# Patient Record
Sex: Male | Born: 1955 | Race: Black or African American | Hispanic: No | Marital: Married | Smoking: Never smoker
Health system: Southern US, Community
[De-identification: ages and names within clinical notes are randomized; demographics above are authoritative.]

## PROBLEM LIST (undated history)

## (undated) DIAGNOSIS — G9332 Myalgic encephalomyelitis/chronic fatigue syndrome: Secondary | ICD-10-CM

## (undated) DIAGNOSIS — M1990 Unspecified osteoarthritis, unspecified site: Secondary | ICD-10-CM

## (undated) DIAGNOSIS — N529 Male erectile dysfunction, unspecified: Secondary | ICD-10-CM

## (undated) DIAGNOSIS — I10 Essential (primary) hypertension: Secondary | ICD-10-CM

## (undated) DIAGNOSIS — M352 Behcet's disease: Secondary | ICD-10-CM

## (undated) DIAGNOSIS — R5382 Chronic fatigue, unspecified: Secondary | ICD-10-CM

## (undated) HISTORY — PX: BACK SURGERY: SHX140

## (undated) HISTORY — DX: Chronic fatigue, unspecified: R53.82

## (undated) HISTORY — DX: Essential (primary) hypertension: I10

## (undated) HISTORY — DX: Unspecified osteoarthritis, unspecified site: M19.90

## (undated) HISTORY — PX: COLONOSCOPY: SHX174

## (undated) HISTORY — DX: Behcet's disease: M35.2

## (undated) HISTORY — PX: HEMORROIDECTOMY: SUR656

## (undated) HISTORY — PX: MENISCUS REPAIR: SHX5179

## (undated) HISTORY — DX: Myalgic encephalomyelitis/chronic fatigue syndrome: G93.32

## (undated) HISTORY — DX: Male erectile dysfunction, unspecified: N52.9

---

## 2007-04-02 ENCOUNTER — Emergency Department (HOSPITAL_COMMUNITY): Admission: EM | Admit: 2007-04-02 | Discharge: 2007-04-02 | Payer: Self-pay | Admitting: Emergency Medicine

## 2008-09-15 ENCOUNTER — Ambulatory Visit (HOSPITAL_COMMUNITY): Admission: RE | Admit: 2008-09-15 | Discharge: 2008-09-15 | Payer: Self-pay | Admitting: *Deleted

## 2008-09-15 ENCOUNTER — Emergency Department (HOSPITAL_COMMUNITY): Admission: EM | Admit: 2008-09-15 | Discharge: 2008-09-15 | Payer: Self-pay | Admitting: Emergency Medicine

## 2008-09-15 ENCOUNTER — Encounter (INDEPENDENT_AMBULATORY_CARE_PROVIDER_SITE_OTHER): Payer: Self-pay | Admitting: *Deleted

## 2009-12-23 ENCOUNTER — Ambulatory Visit: Payer: Self-pay | Admitting: Family Medicine

## 2010-02-02 ENCOUNTER — Ambulatory Visit: Payer: Self-pay | Admitting: Family Medicine

## 2010-02-13 ENCOUNTER — Encounter: Admission: RE | Admit: 2010-02-13 | Discharge: 2010-02-13 | Payer: Self-pay | Admitting: Rheumatology

## 2010-06-03 ENCOUNTER — Ambulatory Visit: Payer: Self-pay | Admitting: Family Medicine

## 2010-11-23 LAB — URINALYSIS, ROUTINE W REFLEX MICROSCOPIC
Glucose, UA: NEGATIVE mg/dL
Ketones, ur: NEGATIVE mg/dL
Nitrite: NEGATIVE

## 2010-11-23 LAB — BASIC METABOLIC PANEL
BUN: 6 mg/dL (ref 6–23)
Calcium: 8.8 mg/dL (ref 8.4–10.5)
Chloride: 105 mEq/L (ref 96–112)
Creatinine, Ser: 1.14 mg/dL (ref 0.4–1.5)
GFR calc Af Amer: >60 mL/min (ref >60)
Glucose, Bld: 115 mg/dL — ABNORMAL HIGH (ref 70–99)
Potassium: 3 mEq/L — ABNORMAL LOW (ref 3.5–5.1)

## 2010-11-23 LAB — DIFFERENTIAL
Monocytes Absolute: 0.5 10*3/uL (ref 0.1–1.0)
Neutro Abs: 4.5 10*3/uL (ref 1.7–7.7)
Neutrophils Relative %: 75 % (ref 43–77)

## 2010-11-23 LAB — CBC
HCT: 40.2 % (ref 39.0–52.0)
Hemoglobin: 13.7 g/dL (ref 13.0–17.0)
MCV: 88.5 fL (ref 78.0–100.0)
Platelets: 244 10*3/uL (ref 150–400)
WBC: 6.1 10*3/uL (ref 4.0–10.5)

## 2010-12-21 NOTE — Op Note (Signed)
[name], Dylan Callahan               ACCOUNT NO.:  [id number]   MEDICAL RECORD NO.:  [id number]          PATIENT TYPE:  AMB   LOCATION:  DAY                          FACILITY:  [facility]   PHYSICIAN:  Alfonse Ras, MD   DATE OF BIRTH:  [birth year]   DATE OF PROCEDURE:  DATE OF DISCHARGE:                               OPERATIVE REPORT   PREOPERATIVE DIAGNOSIS:  Grade III internal hemorrhoids with prolapse.   POSTOPERATIVE DIAGNOSIS:  Grade III internal hemorrhoids with prolapse.   PROCEDURE:  PPH rectopexy.   SURGEON:  Alfonse Ras, MD   ANESTHESIA:  General.   INDICATIONS:  The patient is a very pleasant 55 year old white male with  significant prolapse of internal hemorrhoids and bleeding who was  evaluated in the office and was counseled.  The patient has opted for  [facility] hemorrhoidectomy.  Literature was given to him and extensive  informed consent was granted.  He was then taken to the operating room  at [facility], given general anesthesia by endotracheal tube,  and placed in a prone jack-knife position.  Perianal and rectal prep  were undertaken with Betadine.  The anal dilatation was accomplished to  3 fingerbreadths.  The internal hemorrhoidal bundles were injected with  0.5 Marcaine with Wydase  x3, and then the internal and external  sphincter muscles were injected with additional 0.5 Marcaine.  I then  placed 2-0 pursestring suture 5 cm proximal to the dentate line in the  mucosa and submucosa circumferentially.  After this was accomplished,  stapler was introduced and the pursestring suture was tied down around  the anvil.  It was closed and held in place for 1 minute and then fired.  A good donut of hemorrhoidal tissue was identified.  There was no  evidence of mucosa on inspection.  Staple line was inspected.  There was  no bleeding.  Gelfoam packing was placed,.  A Sterile dressing was  applied.  The patient tolerated the procedure well and went to PACU  in  good condition.      Alfonse Ras, MD  Electronically Signed     KRE/MEDQ  D:  09/15/2008  T:  09/15/2008  Job:  [id number]

## 2011-01-21 ENCOUNTER — Encounter: Payer: Self-pay | Admitting: Family Medicine

## 2011-02-07 ENCOUNTER — Encounter: Payer: Self-pay | Admitting: Family Medicine

## 2011-02-07 ENCOUNTER — Ambulatory Visit (INDEPENDENT_AMBULATORY_CARE_PROVIDER_SITE_OTHER): Payer: 59 | Admitting: Family Medicine

## 2011-02-07 VITALS — BP 114/72 | HR 70 | Ht 71.5 in | Wt 226.0 lb

## 2011-02-07 DIAGNOSIS — Z0000 Encounter for general adult medical examination without abnormal findings: Secondary | ICD-10-CM

## 2011-02-07 DIAGNOSIS — L219 Seborrheic dermatitis, unspecified: Secondary | ICD-10-CM | POA: Insufficient documentation

## 2011-02-07 DIAGNOSIS — N529 Male erectile dysfunction, unspecified: Secondary | ICD-10-CM

## 2011-02-07 DIAGNOSIS — I10 Essential (primary) hypertension: Secondary | ICD-10-CM

## 2011-02-07 LAB — COMPREHENSIVE METABOLIC PANEL
AST: 21 U/L (ref 0–37)
Albumin: 4.5 g/dL (ref 3.5–5.2)
BUN: 10 mg/dL (ref 6–23)
CO2: 26 mEq/L (ref 19–32)
Creat: 1.1 mg/dL (ref 0.50–1.35)
Glucose, Bld: 101 mg/dL — ABNORMAL HIGH (ref 70–99)
Potassium: 4 mEq/L (ref 3.5–5.3)
Sodium: 138 mEq/L (ref 135–145)
Total Bilirubin: 0.5 mg/dL (ref 0.3–1.2)
Total Protein: 7.6 g/dL (ref 6.0–8.3)

## 2011-02-07 LAB — LIPID PANEL: HDL: 54 mg/dL (ref >39)

## 2011-02-07 LAB — POCT URINALYSIS DIPSTICK
Protein, UA: NEGATIVE
pH, UA: 5

## 2011-02-07 LAB — CBC WITH DIFFERENTIAL/PLATELET
Basophils Absolute: 0.1 10*3/uL (ref 0.0–0.1)
Basophils Relative: 1 % (ref 0–1)
Eosinophils Absolute: 0.1 10*3/uL (ref 0.0–0.7)
Eosinophils Relative: 2 % (ref 0–5)
Hemoglobin: 13.8 g/dL (ref 13.0–17.0)
MCV: 89.6 fL (ref 78.0–100.0)
Monocytes Relative: 7 % (ref 3–12)
Platelets: 246 10*3/uL (ref 150–400)

## 2011-02-07 NOTE — Patient Instructions (Signed)
Stay on your present medications. Let me know the name of the skin cream you're using. Call me when you need a refill on the Cialis.

## 2011-02-07 NOTE — Progress Notes (Signed)
  Subjective:    Patient ID: Dylan Callahan, male    DOB: 12/13/1955, 55 y.o.   MRN: [id number]  HPI He is here for a complete examination. He continues on his blood pressure medicine and is having no difficulty with this. He does have an underlying history of seborrheic dermatitis and is apparently on a lotion but does not remember the name. He has been exercising and making dietary changes and has lost several pounds. He does have difficulty with erectile dysfunction and findings Cialis to be the best choice. He has tried Viagra and had difficulty with it making him feel numb.   Review of Systems  Constitutional: Negative.   HENT: Negative.   Eyes: Negative.   Respiratory: Negative.   Cardiovascular: Negative.   Gastrointestinal: Negative.   Genitourinary: Negative.   Musculoskeletal: Negative.   Neurological: Negative.   Hematological: Negative.   Psychiatric/Behavioral: Negative.        Objective:   Physical Exam BP 114/72  Pulse 70  Ht 5' 11.5" (1.816 m)  Wt 226 lb (102.513 kg)  BMI 31.08 kg/m2  General Appearance:    Alert, cooperative, no distress, appears stated age  Head:    Normocephalic, without obvious abnormality, atraumatic  Eyes:    PERRL, conjunctiva/corneas clear, EOM's intact, fundi    benign  Ears:    Normal TM's and external ear canals  Nose:   Nares normal, mucosa normal, no drainage or sinus   tenderness  Throat:   Lips, mucosa, and tongue normal; teeth and gums normal  Neck:   Supple, no lymphadenopathy;  thyroid:  no   enlargement/tenderness/nodules; no carotid   bruit or JVD  Back:    Spine nontender, no curvature, ROM normal, no CVA     tenderness  Lungs:     Clear to auscultation bilaterally without wheezes, rales or     ronchi; respirations unlabored  Chest Wall:    No tenderness or deformity   Heart:    Regular rate and rhythm, S1 and S2 normal, no murmur, rub   or gallop  Breast Exam:    No chest wall tenderness, masses or gynecomastia    Abdomen:     Soft, non-tender, nondistended, normoactive bowel sounds,    no masses, no hepatosplenomegaly  Genitalia:    Normal male external genitalia without lesions.  Testicles without masses.  No inguinal hernias.  Rectal:    Normal sphincter tone, no masses or tenderness; guaiac negative stool.  Prostate smooth, no nodules, not enlarged.  Extremities:   No clubbing, cyanosis or edema  Pulses:   2+ and symmetric all extremities  Skin:   Skin color, texture, turgor normal, no rashes or lesions  Lymph nodes:   Cervical, supraclavicular, and axillary nodes normal  Neurologic:   CNII-XII intact, normal strength, sensation and gait; reflexes 2+ and symmetric throughout          Psych:   Normal mood, affect, hygiene and grooming.           Assessment & Plan:  Hypertension. ED. Seborrheic dermatitis Continue on present medications. Routine blood screening will be done. I will also place a PPD. He will call when he needs a refill on his Cialis. He will also let us know the name of the lotion he is using on his seborrheic dermatitis.

## 2011-02-08 ENCOUNTER — Telehealth: Payer: Self-pay

## 2011-02-08 NOTE — Telephone Encounter (Signed)
Called pt to let him know blood work looked good

## 2011-03-09 ENCOUNTER — Telehealth: Payer: Self-pay | Admitting: Family Medicine

## 2011-03-10 NOTE — Telephone Encounter (Signed)
Pull chart pls

## 2011-03-14 ENCOUNTER — Other Ambulatory Visit: Payer: Self-pay

## 2011-03-14 NOTE — Telephone Encounter (Signed)
Left message for pt to call me back and let me know who gave these RX to him

## 2011-03-15 ENCOUNTER — Other Ambulatory Visit: Payer: Self-pay | Admitting: Medical

## 2011-03-15 ENCOUNTER — Telehealth: Payer: Self-pay

## 2011-03-15 MED ORDER — TRIAMCINOLONE ACETONIDE 0.1 % EX CREA: TOPICAL_CREAM | Freq: Two times a day (BID) | CUTANEOUS | Status: AC

## 2011-03-15 MED ORDER — KETOCONAZOLE 2 % EX CREA: TOPICAL_CREAM | Freq: Every day | CUTANEOUS | Status: AC

## 2011-03-15 MED ORDER — DESONIDE 0.05 % EX CREA: TOPICAL_CREAM | CUTANEOUS | Status: AC

## 2011-03-15 NOTE — Telephone Encounter (Signed)
Dylan Callahan has written all 3 RX for pt he said that pt would have to be seen before any more are written pt said these were written from prev. dr

## 2011-07-15 ENCOUNTER — Telehealth: Payer: Self-pay | Admitting: Family Medicine

## 2011-07-15 MED ORDER — SILDENAFIL CITRATE 50 MG PO TABS: 50.0000 mg | ORAL_TABLET | Freq: Every day | ORAL | Status: DC | PRN

## 2011-07-15 MED ORDER — LISINOPRIL-HYDROCHLOROTHIAZIDE 20-12.5 MG PO TABS: 1.0000 | ORAL_TABLET | Freq: Every day | ORAL | Status: DC

## 2011-07-15 NOTE — Telephone Encounter (Signed)
Viagra and lisinopril were called in

## 2011-07-19 ENCOUNTER — Telehealth: Payer: Self-pay | Admitting: Family Medicine

## 2011-07-19 MED ORDER — LISINOPRIL-HYDROCHLOROTHIAZIDE 20-12.5 MG PO TABS: 1.0000 | ORAL_TABLET | Freq: Every day | ORAL | Status: DC

## 2011-07-19 NOTE — Telephone Encounter (Signed)
Med sent to [facility]

## 2011-07-19 NOTE — Telephone Encounter (Signed)
Pt said med would ship out on wend and need some lisinopril till then sent med in

## 2012-06-26 ENCOUNTER — Telehealth: Payer: Self-pay | Admitting: Internal Medicine

## 2012-06-26 MED ORDER — TADALAFIL 20 MG PO TABS: 20.0000 mg | ORAL_TABLET | ORAL | Status: DC | PRN

## 2012-06-26 NOTE — Telephone Encounter (Signed)
Needs an appointment.

## 2012-06-26 NOTE — Telephone Encounter (Signed)
Called pt and pt is already scheduled to come in for December 16,2013.  Pt is on cialis 20mg  and it needs to be sent [facility]

## 2012-06-26 NOTE — Telephone Encounter (Signed)
Cialis was called in. His previous record indicates Viagra. He is scheduled for followup in December and I will discuss this with him at that time

## 2012-06-27 ENCOUNTER — Telehealth: Payer: Self-pay | Admitting: Family Medicine

## 2012-06-27 NOTE — Telephone Encounter (Signed)
He needs an appointment

## 2012-06-27 NOTE — Telephone Encounter (Signed)
Pt has been informed and has apt 07/23/12

## 2012-07-04 ENCOUNTER — Encounter: Payer: Self-pay | Admitting: Internal Medicine

## 2012-07-23 ENCOUNTER — Encounter: Payer: Self-pay | Admitting: Family Medicine

## 2012-07-23 ENCOUNTER — Ambulatory Visit (INDEPENDENT_AMBULATORY_CARE_PROVIDER_SITE_OTHER): Payer: 59 | Admitting: Family Medicine

## 2012-07-23 VITALS — BP 120/84 | HR 66 | Ht 70.5 in | Wt 228.0 lb

## 2012-07-23 DIAGNOSIS — N529 Male erectile dysfunction, unspecified: Secondary | ICD-10-CM

## 2012-07-23 DIAGNOSIS — I10 Essential (primary) hypertension: Secondary | ICD-10-CM

## 2012-07-23 DIAGNOSIS — Z23 Encounter for immunization: Secondary | ICD-10-CM

## 2012-07-23 DIAGNOSIS — Z0000 Encounter for general adult medical examination without abnormal findings: Secondary | ICD-10-CM

## 2012-07-23 DIAGNOSIS — L219 Seborrheic dermatitis, unspecified: Secondary | ICD-10-CM

## 2012-07-23 DIAGNOSIS — Z125 Encounter for screening for malignant neoplasm of prostate: Secondary | ICD-10-CM

## 2012-07-23 LAB — POCT URINALYSIS DIPSTICK
Bilirubin, UA: NEGATIVE
Glucose, UA: NEGATIVE
Ketones, UA: NEGATIVE
Spec Grav, UA: 1.02
Urobilinogen, UA: NEGATIVE
pH, UA: 5

## 2012-07-23 LAB — COMPREHENSIVE METABOLIC PANEL
ALT: 14 U/L (ref 0–53)
Albumin: 4.3 g/dL (ref 3.5–5.2)
Alkaline Phosphatase: 69 U/L (ref 39–117)
BUN: 9 mg/dL (ref 6–23)
CO2: 26 mEq/L (ref 19–32)
Glucose, Bld: 87 mg/dL (ref 70–99)
Potassium: 4 mEq/L (ref 3.5–5.3)
Sodium: 142 mEq/L (ref 135–145)
Total Bilirubin: 0.5 mg/dL (ref 0.3–1.2)

## 2012-07-23 LAB — CBC WITH DIFFERENTIAL/PLATELET
Basophils Relative: 2 % — ABNORMAL HIGH (ref 0–1)
Eosinophils Relative: 3 % (ref 0–5)
HCT: 38.3 % — ABNORMAL LOW (ref 39.0–52.0)
Lymphocytes Relative: 31 % (ref 12–46)
MCV: 86.3 fL (ref 78.0–100.0)
Monocytes Absolute: 0.5 10*3/uL (ref 0.1–1.0)
Monocytes Relative: 10 % (ref 3–12)
RBC: 4.44 MIL/uL (ref 4.22–5.81)

## 2012-07-23 LAB — LIPID PANEL
Triglycerides: 76 mg/dL (ref <150)
VLDL: 15 mg/dL (ref 0–40)

## 2012-07-23 NOTE — Progress Notes (Signed)
Subjective:    Patient ID: Dylan Callahan, male    DOB: 1956/04/10, 56 y.o.   MRN: [id number]  HPI He is here for complete examination. Approximately 6 weeks ago he had difficulty with dizziness especially with change in position as well as some difficulty swallowing. He stopped his lisinopril and the symptoms went away. He also notes that his erectile issues have also diminished. He does have medications for his seborrheic dermatitis and keeps this under good control. He has no other concerns or complaints. His medical record was reviewed. He will need followup immunizations. He did have a colonoscopy in 2008 and told he needs a repeat but he is not sure who did the original colonoscopy. He has a previous history of difficulty with neck pain and an MRI done which did show some nerve root damage. He did get epidural injections which has helped however he does note some residual right arm and shoulder weakness. He was in the triceps region that he noted the most weakness.   Review of Systems  Constitutional: Negative.   HENT: Negative.   Eyes: Negative.   Respiratory: Negative.   Cardiovascular: Negative.   Gastrointestinal: Negative.   Genitourinary: Negative.   Musculoskeletal: Negative.   Skin: Negative.   Neurological: Negative.   Hematological: Negative.   Psychiatric/Behavioral: Negative.        Objective:   Physical Exam BP 120/84  Pulse 66  Ht 5' 10.5" (1.791 m)  Wt 228 lb (103.42 kg)  BMI 32.25 kg/m2  SpO2 99%  General Appearance:    Alert, cooperative, no distress, appears stated age  Head:    Normocephalic, without obvious abnormality, atraumatic  Eyes:    PERRL, conjunctiva/corneas clear, EOM's intact, fundi    benign  Ears:    Normal TM's and external ear canals  Nose:   Nares normal, mucosa normal, no drainage or sinus   tenderness  Throat:   Lips, mucosa, and tongue normal; teeth and gums normal  Neck:   Supple, no lymphadenopathy;  thyroid:  no    enlargement/tenderness/nodules; no carotid   bruit or JVD  Back:    Spine nontender, no curvature, ROM normal, no CVA     tenderness  Lungs:     Clear to auscultation bilaterally without wheezes, rales or     ronchi; respirations unlabored  Chest Wall:    No tenderness or deformity   Heart:    Regular rate and rhythm, S1 and S2 normal, no murmur, rub   or gallop  Breast Exam:    No chest wall tenderness, masses or gynecomastia  Abdomen:     Soft, non-tender, nondistended, normoactive bowel sounds,    no masses, no hepatosplenomegaly  Genitalia:    Normal male external genitalia without lesions.  Testicles without masses.  No inguinal hernias.  Rectal:    deferred   Extremities:   No clubbing, cyanosis or edema  Pulses:   2+ and symmetric all extremities  Skin:   Skin color, texture, turgor normal, no rashes or lesions  Lymph nodes:   Cervical, supraclavicular, and axillary nodes normal  Neurologic:   CNII-XII intact, normal strength, sensation and gait; reflexes 2+ and symmetric throughout          Psych:   Normal mood, affect, hygiene and grooming.           Assessment & Plan:   1. Routine general medical examination at a health care facility  POCT Urinalysis Dipstick, Lipid panel, CBC with Differential, Comprehensive  metabolic panel, PSA  2. Need for prophylactic vaccination and inoculation against influenza  Flu vaccine greater than or equal to 3yo preservative free IM  3. Immunization due  Tdap vaccine greater than or equal to 7yo IM  4. Special screening for malignant neoplasm of prostate  PSA  5. Seborrheic dermatitis    6. Hypertension    7. ED (erectile dysfunction)     he is to check back with [place] concerning who did the colonoscopy suite and review this. Discussed the continued difficulty with right arm and hand and explained that surgery at this time would not be appropriate since he is really not having any pain with this. He was comfortable with this. His immunizations  were updated. Risks and benefits were discussed.

## 2012-07-24 LAB — PSA: PSA: 0.31 ng/mL (ref <=4.00)

## 2012-07-24 NOTE — Progress Notes (Signed)
Quick Note:  CALLED PT CELL # TO INFORM HIM LABS LOOK GOOD LEFT MESSAGE ______

## 2012-09-24 ENCOUNTER — Ambulatory Visit (INDEPENDENT_AMBULATORY_CARE_PROVIDER_SITE_OTHER): Payer: 59 | Admitting: Family Medicine

## 2012-09-24 ENCOUNTER — Encounter: Payer: Self-pay | Admitting: Family Medicine

## 2012-09-24 VITALS — BP 126/74 | HR 86 | Wt 228.0 lb

## 2012-09-24 DIAGNOSIS — I10 Essential (primary) hypertension: Secondary | ICD-10-CM

## 2012-09-24 NOTE — Progress Notes (Signed)
  Subjective:    Patient ID: Dylan Callahan, male    DOB: [birth year], 57 y.o.   MRN: [id number]  HPI He is here for recheck. He continues off of his lisinopril. He has had no complaints. He is involved in regular exercise program.   Review of Systems     Objective:   Physical Exam  Alert and in no distress blood pressure is recorded.      Assessment & Plan:  Hypertension since his blood pressure is normal, I will hold any blood pressure medications. Discussed the need for him to continue with a diet and exercise program and the fact that we will need to monitor his blood pressure. Explained that with age, his blood pressure will probably rise.

## 2012-10-23 ENCOUNTER — Telehealth: Payer: Self-pay | Admitting: Family Medicine

## 2012-10-23 NOTE — Telephone Encounter (Signed)
CALLED PT TO INFORM HIM DR.MEDOFF AT [phone] TO CALL AND MAKE HIS APPT PER DR.MEDOFF STAFF.PT VERBALIZED UNDERSTANDING

## 2013-05-02 ENCOUNTER — Encounter (HOSPITAL_COMMUNITY): Payer: Self-pay | Admitting: Emergency Medicine

## 2013-05-02 ENCOUNTER — Emergency Department (HOSPITAL_COMMUNITY)
Admission: EM | Admit: 2013-05-02 | Discharge: 2013-05-02 | Disposition: A | Discharge status: Home / Self Care | Payer: BC Managed Care – PPO | Attending: Emergency Medicine | Admitting: Emergency Medicine

## 2013-05-02 ENCOUNTER — Emergency Department (HOSPITAL_COMMUNITY): Payer: BC Managed Care – PPO

## 2013-05-02 DIAGNOSIS — M2342 Loose body in knee, left knee: Secondary | ICD-10-CM

## 2013-05-02 DIAGNOSIS — M2340 Loose body in knee, unspecified knee: Secondary | ICD-10-CM | POA: Insufficient documentation

## 2013-05-02 DIAGNOSIS — Z7982 Long term (current) use of aspirin: Secondary | ICD-10-CM | POA: Insufficient documentation

## 2013-05-02 DIAGNOSIS — M79609 Pain in unspecified limb: Secondary | ICD-10-CM

## 2013-05-02 DIAGNOSIS — M25462 Effusion, left knee: Secondary | ICD-10-CM

## 2013-05-02 DIAGNOSIS — I10 Essential (primary) hypertension: Secondary | ICD-10-CM | POA: Insufficient documentation

## 2013-05-02 DIAGNOSIS — Z87448 Personal history of other diseases of urinary system: Secondary | ICD-10-CM | POA: Insufficient documentation

## 2013-05-02 DIAGNOSIS — M25469 Effusion, unspecified knee: Secondary | ICD-10-CM | POA: Insufficient documentation

## 2013-05-02 MED ORDER — OXYCODONE-ACETAMINOPHEN 5-325 MG PO TABS: 1.0000 | ORAL_TABLET | Freq: Four times a day (QID) | ORAL | Status: DC | PRN

## 2013-05-02 MED ORDER — HYDROCODONE-ACETAMINOPHEN 5-325 MG PO TABS: 1.0000 | ORAL_TABLET | ORAL | Status: DC | PRN

## 2013-05-02 MED ORDER — ONDANSETRON 4 MG PO TBDP
4.0000 mg | ORAL_TABLET | Freq: Once | ORAL | Status: AC
  Administered 2013-05-02: 4 mg via ORAL
  Filled 2013-05-02: qty 1

## 2013-05-02 MED ORDER — MORPHINE SULFATE 4 MG/ML IJ SOLN
4.0000 mg | Freq: Once | INTRAMUSCULAR | Status: AC
  Administered 2013-05-02: 4 mg via INTRAMUSCULAR
  Filled 2013-05-02: qty 1

## 2013-05-02 NOTE — ED Notes (Signed)
Pt c/o left knee pain x 3 days since driving back from [place]; pt denies obvious injury

## 2013-05-02 NOTE — Progress Notes (Signed)
*  Preliminary Results* Left lower extremity venous duplex completed. Left lower extremity is negative for deep vein thrombosis. There is no evidence of left Baker's cyst.  Preliminary results discussed with Marlon Pel, PA.  05/02/2013 4:34 PM  Gertie Fey, RVT, RDCS, RDMS

## 2013-05-02 NOTE — ED Provider Notes (Signed)
CSN: [id number]     Arrival date & time 05/02/13  1508 History  This chart was scribed for Marlon Pel, PA, working with Junius Argyle, MD by Blanchard Kelch, ED Scribe. This patient was seen in room TR06C/TR06C and the patient's care was started at 3:18 PM.    Chief Complaint  Patient presents with  . Knee Pain    Patient is a 57 y.o. male presenting with knee pain. The history is provided by the patient. No language interpreter was used.  Knee Pain   HPI Comments: Dylan Callahan is a 57 y.o. male who presents to the Emergency Department complaining of worsening, constant left knee pain that began about three days ago after driving 2.5 hours home from [place]. He complains of associated ankle and leg swelling. He describes the pain as aching and throbbing. He denies being able to fall asleep regularly last night or walk normally due to the pain. He has been taking Tylenol for the pain without relief. He denies any history of gout or blood clot. He denies noticed any color change in his toes. He denies allergies to any medications. 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   Past Medical History  Diagnosis Date  . Hypertension   . ED (erectile dysfunction)    Past Surgical History  Procedure Laterality Date  . Colonoscopy     History reviewed. No pertinent family history. History  Substance Use Topics  . Smoking status: Never Smoker   . Smokeless tobacco: Never Used  . Alcohol Use: 8.4 oz/week    14 Glasses of wine per week    Review of Systems  Cardiovascular: Positive for leg swelling.  Musculoskeletal: Positive for arthralgias.  All other systems reviewed and are negative.    Allergies  Review of patient's allergies indicates no known allergies.  Home Medications   Current Outpatient Rx  Name  Route  Sig  Dispense  Refill  . aspirin  81 MG tablet   Oral   Take 81 mg by mouth daily.            Triage Vitals: BP 137/83  Pulse 74  Temp(Src) 98.4 F (36.9 C) (Oral)  Resp 18  Ht 6' (1.829 m)  Wt 225 lb (102.059 kg)  BMI 30.51 kg/m2  SpO2 98%  Physical Exam  Nursing note and vitals reviewed. Constitutional: He appears well-developed and well-nourished. No distress.  HENT:  Head: Normocephalic and atraumatic.  Eyes: Pupils are equal, round, and reactive to light.  Neck: Normal range of motion. Neck supple.  Cardiovascular: Normal rate and regular rhythm.   Pulmonary/Chest: Effort normal.  Abdominal: Soft.  Musculoskeletal:       Left knee: He exhibits decreased range of motion, swelling and effusion. He exhibits no ecchymosis, no deformity, no laceration and no erythema. Tenderness (diffuse) found.  Neurological: He is alert.  Skin: Skin is warm and dry.    ED Course  Procedures (including critical care time)  DIAGNOSTIC STUDIES: Oxygen Saturation is 98% on room air, normal by my interpretation.    COORDINATION OF CARE: 3:31 PM -Clinical suspicion of blood clot.  Patient verbalizes understanding and agrees with treatment plan.  4:37 PM-US shows no DVTs but his xray shows loose bodies and small to moderate effusion.  Labs Review Labs Reviewed - No data to display Imaging  Review Dg Knee Complete 4 Views Left  05/02/2013   CLINICAL DATA:  Left knee pain and swelling. No recent injury.  EXAM: LEFT KNEE - COMPLETE 4+ VIEW  COMPARISON:  None.  FINDINGS: Spur formation involving all 3 joint compartments. Small to moderate-sized effusion. Central loose bodies.  IMPRESSION: Degenerative changes, loose bodies and small to moderate-sized effusion.   Electronically Signed   By: Gordan Payment   On: 05/02/2013 16:06    MDM   1. Bodies, loose, knee, left   2. Joint effusion, knee, left      Author: Gwendolyn Fill Service: Vascular Lab Author Type: Cardiovascular Sonographer   Filed: 05/02/2013 4:37 PM Note  Time: 05/02/2013 4:34 PM         *Preliminary Results*  Left lower extremity venous duplex completed.  Left lower extremity is negative for deep vein thrombosis. There is no evidence of left Baker's cyst.  Preliminary results discussed with Marlon Pel, PA.  05/02/2013 4:34 PM  Gertie Fey, RVT, RDCS, RDMS    Xray shows loose bodies and joint effusion. Pt denies injury and the joint lacks erythema or induration suspicious for septic joint. Vitals stable.  Knee immobilizer placed, pt could not tolerate knee sleeve. Also given crutches. Rx for Percocet Pt to follow-up with Rheumatologist as soon as possible.  57 y.o.Clide Dales A Tetreault's evaluation in the Emergency Department is complete. It has been determined that no acute conditions requiring further emergency intervention are present at this time. The patient/guardian have been advised of the diagnosis and plan. We have discussed signs and symptoms that warrant return to the ED, such as changes or worsening in symptoms.  Vital signs are stable at discharge. Filed Vitals:   05/02/13 1514  BP: 137/83  Pulse: 74  Temp: 98.4 F (36.9 C)  Resp: 18    Patient/guardian has voiced understanding and agreed to follow-up with the PCP or specialist.  I personally performed the services described in this documentation, which was scribed in my presence. The recorded information has been reviewed and is accurate.   Dorthula Matas, PA-C 05/02/13 1641  Dorthula Matas, PA-C 05/02/13 1643

## 2013-05-03 NOTE — ED Provider Notes (Signed)
Medical screening examination/treatment/procedure(s) were performed by non-physician practitioner and as supervising physician I was immediately available for consultation/collaboration.   Keondra Haydu S Hazelene Doten, MD 05/03/13 1458 

## 2013-05-15 ENCOUNTER — Encounter: Payer: Self-pay | Admitting: Family Medicine

## 2013-05-15 ENCOUNTER — Ambulatory Visit (INDEPENDENT_AMBULATORY_CARE_PROVIDER_SITE_OTHER): Payer: BC Managed Care – PPO | Admitting: Family Medicine

## 2013-05-15 VITALS — BP 130/80 | HR 70 | Ht 72.0 in | Wt 225.0 lb

## 2013-05-15 DIAGNOSIS — M352 Behcet's disease: Secondary | ICD-10-CM | POA: Insufficient documentation

## 2013-05-15 DIAGNOSIS — Z0000 Encounter for general adult medical examination without abnormal findings: Secondary | ICD-10-CM

## 2013-05-15 DIAGNOSIS — M2342 Loose body in knee, left knee: Secondary | ICD-10-CM

## 2013-05-15 DIAGNOSIS — Z860100 Personal history of colon polyps, unspecified: Secondary | ICD-10-CM | POA: Insufficient documentation

## 2013-05-15 DIAGNOSIS — I10 Essential (primary) hypertension: Secondary | ICD-10-CM

## 2013-05-15 DIAGNOSIS — Z23 Encounter for immunization: Secondary | ICD-10-CM

## 2013-05-15 DIAGNOSIS — Z125 Encounter for screening for malignant neoplasm of prostate: Secondary | ICD-10-CM

## 2013-05-15 DIAGNOSIS — Z111 Encounter for screening for respiratory tuberculosis: Secondary | ICD-10-CM

## 2013-05-15 DIAGNOSIS — M2340 Loose body in knee, unspecified knee: Secondary | ICD-10-CM

## 2013-05-15 LAB — CBC WITH DIFFERENTIAL/PLATELET
Basophils Absolute: 0 10*3/uL (ref 0.0–0.1)
Basophils Relative: 1 % (ref 0–1)
Lymphocytes Relative: 36 % (ref 12–46)
MCHC: 33.8 g/dL (ref 30.0–36.0)
MCV: 87.2 fL (ref 78.0–100.0)
Monocytes Absolute: 0.4 10*3/uL (ref 0.1–1.0)
Neutro Abs: 2.9 10*3/uL (ref 1.7–7.7)
Neutrophils Relative %: 54 % (ref 43–77)
Platelets: 272 10*3/uL (ref 150–400)
RBC: 4.75 MIL/uL (ref 4.22–5.81)
RDW: 13.2 % (ref 11.5–15.5)
WBC: 5.5 10*3/uL (ref 4.0–10.5)

## 2013-05-15 LAB — COMPREHENSIVE METABOLIC PANEL
ALT: 13 U/L (ref 0–53)
AST: 16 U/L (ref 0–37)
Albumin: 4.1 g/dL (ref 3.5–5.2)
Alkaline Phosphatase: 68 U/L (ref 39–117)
BUN: 10 mg/dL (ref 6–23)
Calcium: 9.3 mg/dL (ref 8.4–10.5)
Chloride: 106 mEq/L (ref 96–112)
Creat: 1.09 mg/dL (ref 0.50–1.35)
Potassium: 4.1 mEq/L (ref 3.5–5.3)
Sodium: 138 mEq/L (ref 135–145)
Total Bilirubin: 0.4 mg/dL (ref 0.3–1.2)
Total Protein: 7.1 g/dL (ref 6.0–8.3)

## 2013-05-15 LAB — POCT URINALYSIS DIPSTICK
Glucose, UA: NEGATIVE
Leukocytes, UA: NEGATIVE
Protein, UA: NEGATIVE
Urobilinogen, UA: NEGATIVE

## 2013-05-15 LAB — LIPID PANEL
LDL Cholesterol: 136 mg/dL — ABNORMAL HIGH (ref 0–99)
Total CHOL/HDL Ratio: 3.9 Ratio

## 2013-05-15 NOTE — Progress Notes (Signed)
Subjective:    Patient ID: Dylan Callahan, male    DOB: January 20, 1956, 57 y.o.   MRN: [id number]  HPI He is here for complete examination. He was recently seen in the emergency room for evaluation of left knee pain. X-rays did show evidence of loose bodies. At this time he is not having much pain. He also is dealing with right-sided neck pain and has seen neurosurgery. Apparently also has had epidural injections which were not useful. He is concerned over the fact that the surgeon could not guarantee success. He has a previous history of hypertension however presently he is not on any medications and his blood pressure is normal. 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 He has seen Dr. Kellie Simmering in the past apparently for this. He also has a history of colonic polyps and apparently needs a followup colonoscopy. He was laid off from his job which has some stress.   Review of Systems Negative except as above    Objective:   Physical Exam BP 130/80  Pulse 70  Ht 6' (1.829 m)  Wt 225 lb (102.059 kg)  BMI 30.51 kg/m2  General Appearance:    Alert, cooperative, no distress, appears stated age  Head:    Normocephalic, without obvious abnormality, atraumatic  Eyes:    PERRL, conjunctiva/corneas clear, EOM's intact, fundi    Could not be evaluated   Ears:    Normal TM's and external ear canals  Nose:   Nares normal, mucosa normal, no drainage or sinus   tenderness  Throat:   Lips, mucosa, and tongue normal; teeth and gums normal  Neck:   Supple, no lymphadenopathy;  thyroid:  no   enlargement/tenderness/nodules; no carotid   bruit or JVD  Back:    Spine nontender, no curvature, ROM normal, no CVA     tenderness  Lungs:     Clear to auscultation bilaterally without wheezes, rales or     ronchi; respirations unlabored  Chest Wall:    No tenderness or deformity   Heart:    Regular rate and rhythm, S1 and S2 normal, no murmur, rub   or gallop  Breast Exam:     No chest wall tenderness, masses or gynecomastia  Abdomen:     Soft, non-tender, nondistended, normoactive bowel sounds,    no masses, no hepatosplenomegaly  Genitalia:   deferred   Rectal:   deferred   Extremities:   No clubbing, cyanosis or edema  Pulses:   2+ and symmetric all extremities  Skin:   Skin color, texture, turgor normal, no rashes or lesions  Lymph nodes:   Cervical, supraclavicular, and axillary nodes normal  Neurologic:   CNII-XII intact, normal strength, sensation and gait; reflexes 2+ and symmetric throughout          Psych:   Normal mood, affect, hygiene and grooming.          Assessment & Plan:  Routine general medical examination at a health care facility - Plan: CBC with Differential, Comprehensive metabolic panel, Lipid panel, PSA  Hypertension - Plan: POCT Urinalysis Dipstick  Need for prophylactic vaccination and inoculation against influenza - Plan: Flu Vaccine QUAD 36+ mos PF IM (Fluarix)  Bodies, loose, knee, left - Plan: Ambulatory referral to Orthopedic Surgery  Behcet's syndrome  Special screening for malignant neoplasm of prostate - Plan: PSA  Screening examination for pulmonary tuberculosis - Plan: TB Skin Test  Personal history of colonic polyps  I discussed  the cervical disc surgery in detail with him. Explained that no one could or should give him 100% guarantee. Strongly encouraged him to go ahead and have the surgery. I will also refer him to orthopedics for further evaluation of loose bodies. After these are resolved we'll then set him up for colonoscopy. Flu shot given with risks and benefits discussed.

## 2013-05-16 LAB — PSA: PSA: 0.21 ng/mL (ref <=4.00)

## 2013-05-17 LAB — TB SKIN TEST: TB Skin Test: NEGATIVE

## 2013-05-17 NOTE — Addendum Note (Signed)
Addended by: Debbrah Alar F on: 05/17/2013 11:47 AM   Modules accepted: Orders

## 2013-06-27 ENCOUNTER — Encounter: Payer: Self-pay | Admitting: Internal Medicine

## 2013-07-11 ENCOUNTER — Encounter: Payer: Self-pay | Admitting: Family Medicine

## 2013-07-29 ENCOUNTER — Telehealth: Payer: Self-pay | Admitting: Family Medicine

## 2013-07-29 MED ORDER — TADALAFIL 5 MG PO TABS: 5.0000 mg | ORAL_TABLET | Freq: Every day | ORAL | Status: DC | PRN

## 2013-07-29 NOTE — Telephone Encounter (Signed)
5 mg Cialis called in at patient request

## 2013-07-31 ENCOUNTER — Telehealth: Payer: Self-pay | Admitting: Family Medicine

## 2013-07-31 MED ORDER — TADALAFIL 5 MG PO TABS: 5.0000 mg | ORAL_TABLET | Freq: Every day | ORAL | Status: DC | PRN

## 2013-07-31 NOTE — Telephone Encounter (Signed)
Med was sent to wrong pharmacy. i have sent it to the right pharmacy

## 2013-12-04 ENCOUNTER — Telehealth: Payer: Self-pay | Admitting: Internal Medicine

## 2013-12-04 NOTE — Telephone Encounter (Signed)
Patient called again and decided he does not want to go to Dr. Orlin HildingAngela Hawks as he doesn't think she has the experience.  He does want to be referred to Dr. Roselie SkinnerJames Frederick Beekman at [address] instead.

## 2013-12-04 NOTE — Telephone Encounter (Signed)
Pt states that he wants to go see Dr. Orlin HildingAngela Hawks @ [facility] for Rheumatologist for his Behecet's syndrome. Is it okay to do the referral

## 2013-12-05 NOTE — Telephone Encounter (Signed)
Refer to Dr Beekman 

## 2013-12-05 NOTE — Telephone Encounter (Signed)
I have faxed over all info from labs and physical with Behecet's syndrome for Dr. Dierdre Forthbeekman to look over and see if he will take person on. Dr. Dierdre ForthBeekman fax # [phone]

## 2013-12-10 ENCOUNTER — Telehealth: Payer: Self-pay | Admitting: Internal Medicine

## 2013-12-10 NOTE — Telephone Encounter (Signed)
Pt has an appt with Dr. Dierdre ForthBeekman on 01/01/14 @ 10:30am @ [facility]. Left message on pt vm of the date and time of appt

## 2014-01-08 ENCOUNTER — Other Ambulatory Visit: Payer: Self-pay | Admitting: Internal Medicine

## 2014-01-08 DIAGNOSIS — R5383 Other fatigue: Secondary | ICD-10-CM

## 2014-01-08 DIAGNOSIS — R413 Other amnesia: Secondary | ICD-10-CM

## 2014-01-13 ENCOUNTER — Ambulatory Visit
Admission: RE | Admit: 2014-01-13 | Discharge: 2014-01-13 | Disposition: A | Discharge status: Home / Self Care | Payer: BC Managed Care – PPO | Source: Ambulatory Visit | Attending: Internal Medicine | Admitting: Internal Medicine

## 2014-01-13 DIAGNOSIS — R5383 Other fatigue: Secondary | ICD-10-CM

## 2014-01-13 DIAGNOSIS — R413 Other amnesia: Secondary | ICD-10-CM

## 2014-01-13 MED ORDER — GADOBENATE DIMEGLUMINE 529 MG/ML IV SOLN
20.0000 mL | Freq: Once | INTRAVENOUS | Status: AC | PRN
  Administered 2014-01-13: 20 mL via INTRAVENOUS

## 2014-01-15 ENCOUNTER — Telehealth: Payer: Self-pay | Admitting: Internal Medicine

## 2014-01-15 NOTE — Telephone Encounter (Signed)
Faxed over medical records to [employer] @ [phone]

## 2014-01-27 DIAGNOSIS — Z029 Encounter for administrative examinations, unspecified: Secondary | ICD-10-CM

## 2014-01-28 ENCOUNTER — Telehealth: Payer: Self-pay | Admitting: Family Medicine

## 2014-01-28 MED ORDER — VARDENAFIL HCL 20 MG PO TABS: 20.0000 mg | ORAL_TABLET | Freq: Every day | ORAL | Status: DC | PRN

## 2014-01-28 NOTE — Telephone Encounter (Signed)
[employer] states Cialis & Levitra are both covered for 20mg  either & max #4 pills for 30 days  Patient request that you change him to Levitra 20mg  [facility] to [facility] [address]

## 2014-01-29 ENCOUNTER — Encounter: Payer: Self-pay | Admitting: Diagnostic Neuroimaging

## 2014-01-29 ENCOUNTER — Ambulatory Visit (INDEPENDENT_AMBULATORY_CARE_PROVIDER_SITE_OTHER): Payer: BC Managed Care – PPO | Admitting: Diagnostic Neuroimaging

## 2014-01-29 VITALS — BP 138/94 | HR 76 | Temp 97.2°F | Ht 72.0 in | Wt 234.5 lb

## 2014-01-29 DIAGNOSIS — G894 Chronic pain syndrome: Secondary | ICD-10-CM

## 2014-01-29 DIAGNOSIS — IMO0001 Reserved for inherently not codable concepts without codable children: Secondary | ICD-10-CM

## 2014-01-29 DIAGNOSIS — M352 Behcet's disease: Secondary | ICD-10-CM

## 2014-01-29 DIAGNOSIS — F411 Generalized anxiety disorder: Secondary | ICD-10-CM

## 2014-01-29 DIAGNOSIS — F329 Major depressive disorder, single episode, unspecified: Secondary | ICD-10-CM

## 2014-01-29 DIAGNOSIS — F32A Depression, unspecified: Secondary | ICD-10-CM

## 2014-01-29 DIAGNOSIS — G44209 Tension-type headache, unspecified, not intractable: Secondary | ICD-10-CM

## 2014-01-29 DIAGNOSIS — F3289 Other specified depressive episodes: Secondary | ICD-10-CM

## 2014-01-29 DIAGNOSIS — G4700 Insomnia, unspecified: Secondary | ICD-10-CM

## 2014-01-29 DIAGNOSIS — M791 Myalgia, unspecified site: Secondary | ICD-10-CM

## 2014-01-29 DIAGNOSIS — M2550 Pain in unspecified joint: Secondary | ICD-10-CM

## 2014-01-29 NOTE — Progress Notes (Signed)
[facility]  PATIENT: Dylan Callahan DOB: [birth year]  REFERRING CLINICIAN: Marquis Buggy HISTORY FROM: patient  REASON FOR VISIT: new consult   HISTORICAL  CHIEF COMPLAINT:  Chief Complaint  Patient presents with  . 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  In 1985 patient developed onset of flulike symptoms, high fevers, weight loss, oral ulcers and sores, blurred vision, and was admitted to hospital in [place]. 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 He had intermittent flareups over the years. He was never on long-term immunosuppression. He did well for many years until 2011 when he began to have increasing flareups.  By 2013, 2014, patient noticed some increasing memory problems. 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 This caused significant stress, anxiety and depression. 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 Patient currently unemployed. He is having significant stress related to these career, legal and financial problems.  Patient notes memory loss and confusion with short-term memory loss, losing objects, poor focus, difficulty with multitasking.  Patient having generalized fatigue, myalgia, arthralgia pain.  Patient having increasing headaches since April 2015 with retro-orbital pain, frontal pain, occipital pain, photophobia. He describes pressure tension-type headache. No nausea vomiting. No vision changes. Headaches occur daily basis lasting 30-60 minutes.  Patient having significant depression, anxiety and insomnia. Previously was having some suicidal and homicidal thoughts.  Currently intermittent suicidal thoughts but he has no specific plan and is able to contract for safety.  REVIEW OF SYSTEMS: Full 14 system review of systems performed and notable only for insomnia confusion headache weakness difficult swallowing dizziness depression anxiety and out of sleep decreased energy disinterest activity suicidal thoughts aching muscles blurred vision weight loss fatigue.   ALLERGIES: No Known Allergies  HOME MEDICATIONS: Outpatient Prescriptions Prior to Visit  Medication Sig Dispense Refill  . aspirin 81 MG tablet Take 81 mg by mouth daily.        . vardenafil (LEVITRA) 20 MG tablet Take 1 tablet (20 mg total) by mouth daily as needed for erectile dysfunction.  4 tablet  11   No facility-administered medications prior to visit.    PAST MEDICAL HISTORY: Past Medical History  Diagnosis Date  . Hypertension   . ED (erectile dysfunction)   . Behcet's syndrome     PAST SURGICAL HISTORY: Past Surgical History  Procedure Laterality Date  . Colonoscopy      FAMILY HISTORY: Family History  Problem Relation Age of Onset  . Dementia Mother     SOCIAL HISTORY:  History   Social History  . Marital Status: Single    Spouse Name: N/A    Number of Children: N/A  . Years of Education: N/A   Occupational History  . Not on file.   Social History Main Topics  . Smoking status: Never Smoker   . Smokeless tobacco: Never Used  . Alcohol Use: 8.4 oz/week    14 Glasses of wine per week  . Drug Use: No  . Sexual Activity: Yes   Other Topics Concern  . Not on file   Social History Narrative  . No narrative on file     PHYSICAL EXAM  Filed  Vitals:   01/29/14 0905 01/29/14 0906 01/29/14 0907  BP:  126/78 138/94  Pulse:  68 76  Temp: 97.2 F (36.2 C)    TempSrc: Oral    Height: 6' (1.829 m)    Weight: 234 lb 8 oz (106.369 kg)      Not recorded    Body mass index is 31.8 kg/(m^2).  GENERAL EXAM: Patient is in no distress; well developed,  nourished and groomed; neck is supple  CARDIOVASCULAR: Regular rate and rhythm, no murmurs, no carotid bruits  NEUROLOGIC: MENTAL STATUS: awake, alert, oriented to person, place and time, recent and remote memory intact, normal attention and concentration, language fluent, comprehension intact, naming intact, fund of knowledge appropriate; MMSE 26/30. GDS 13. AFT 15. TEARFUL AT END OF VISIT. CRANIAL NERVE: no papilledema on fundoscopic exam, pupils equal and reactive to light, visual fields full to confrontation, extraocular muscles intact, no nystagmus, facial sensation and strength symmetric, hearing intact, palate elevates symmetrically, uvula midline, shoulder shrug symmetric, tongue midline. MOTOR: normal bulk and tone, full strength in the BUE, BLE SENSORY: normal and symmetric to light touch, pinprick, temperature, vibration COORDINATION: finger-nose-finger, fine finger movements normal REFLEXES: deep tendon reflexes present and symmetric GAIT/STATION: narrow based gait; able to walk on toes, heels and tandem; romberg is negative    DIAGNOSTIC DATA (LABS, IMAGING, TESTING) - I reviewed patient records, labs, notes, testing and imaging myself where available.  Lab Results  Component Value Date   WBC 5.5 05/15/2013   HGB 14.0 05/15/2013   HCT 41.4 05/15/2013   MCV 87.2 05/15/2013   PLT 272 05/15/2013      Component Value Date/Time   NA 138 05/15/2013 1518   K 4.1 05/15/2013 1518   CL 106 05/15/2013 1518   CO2 26 05/15/2013 1518   GLUCOSE 96 05/15/2013 1518   BUN 10 05/15/2013 1518   CREATININE 1.09 05/15/2013 1518   CREATININE 1.14 09/15/2008 0620   CALCIUM 9.3 05/15/2013 1518   PROT 7.1 05/15/2013 1518   ALBUMIN 4.1 05/15/2013 1518   AST 16 05/15/2013 1518   ALT 13 05/15/2013 1518   ALKPHOS 68 05/15/2013 1518   BILITOT 0.4 05/15/2013 1518   GFRNONAA >60 09/15/2008 0620   GFRAA  Value: >60        The eGFR has been calculated using the MDRD equation. This calculation has not been validated  in all clinical situations. eGFR's persistently <60 mL/min signify possible Chronic Kidney Disease. 09/15/2008 7408   Lab Results  Component Value Date   CHOL 200 05/15/2013   HDL 51 05/15/2013   LDLCALC 136* 05/15/2013   TRIG 67 05/15/2013   CHOLHDL 3.9 05/15/2013   No results found for this basename: HGBA1C   No results found for this basename: VITAMINB12   No results found for this basename: TSH    I reviewed images myself and agree with interpretation. -VRP  01/13/14 MRI brain 1. No evidence of acute intracranial abnormality or mass.  2. Foci of white matter T2 signal abnormality, greater than expected  for patient's age and nonspecific. Considerations include small  vessel ischemia, sequelae of trauma, hypercoagulable state,  vasculitis, migraines, prior infection or demyelination.   ASSESSMENT AND PLAN  58 y.o. year old male here with Behcet's disease, worsening over past year, with other sxs including HA, fatigue, pain, memory loss, depression. Suspect that these other symptoms are related to increasing pain, inflammation, mood difficulty, sleep problems, life events. MRI brain shows no abnormal enhancing lesions to suggest active  neuro Behcet's disease. Patient would benefit from psychiatry and pain mgmt.  Ddx of memory/cognitive issues: depression, anxiety, insomnia, chronic pain, life events (legal, career, financial)  Ddx of headaches: behcet's, tension HA, cervicogenic, depression, insomnia  Ddx of fatigue: same as above   PLAN: - psychiatry/psychology evaluation encouraged; patient will think about - pain management - no active neuro-behcet's based on MRI brain; however, his above symptomatology could be related to systemic behcet's disease effects. Defer to Dr. Amil Amen re: Behcet's long term immunomodulation  Return in about 6 months (around 07/31/2014).    Penni Bombard, MD 7/61/4709, 29:57 AM Certified in Neurology, Neurophysiology and  Neuroimaging  [facility] [address], [place], [place] [address] [phone]

## 2014-01-29 NOTE — Patient Instructions (Signed)
Follow up with PCP, rheumatology.  Consider evaluation by psychiatry/psychology.

## 2014-04-23 ENCOUNTER — Telehealth: Payer: Self-pay | Admitting: Family Medicine

## 2014-04-23 NOTE — Telephone Encounter (Signed)
Please call re prior authorization for levitra. He spoke to insurance and pharmacy and was told he needs prior authorization    [facility]

## 2014-04-29 NOTE — Telephone Encounter (Signed)
P.A. Levitra approved til 08/07/38, pt informed, faxed pharmacy

## 2014-07-09 ENCOUNTER — Encounter: Payer: Self-pay | Admitting: Family Medicine

## 2014-07-09 ENCOUNTER — Ambulatory Visit (INDEPENDENT_AMBULATORY_CARE_PROVIDER_SITE_OTHER): Payer: BC Managed Care – PPO | Admitting: Family Medicine

## 2014-07-09 VITALS — BP 120/80 | HR 80 | Ht 72.0 in | Wt 237.0 lb

## 2014-07-09 DIAGNOSIS — M352 Behcet's disease: Secondary | ICD-10-CM

## 2014-07-09 DIAGNOSIS — M5090 Cervical disc disorder, unspecified, unspecified cervical region: Secondary | ICD-10-CM

## 2014-07-09 DIAGNOSIS — Z0000 Encounter for general adult medical examination without abnormal findings: Secondary | ICD-10-CM

## 2014-07-09 DIAGNOSIS — M797 Fibromyalgia: Secondary | ICD-10-CM

## 2014-07-09 LAB — LIPID PANEL
CHOLESTEROL: 214 mg/dL — AB (ref 0–200)
HDL: 53 mg/dL (ref >39)
LDL CALC: 142 mg/dL — AB (ref 0–99)
Total CHOL/HDL Ratio: 4 Ratio
Triglycerides: 94 mg/dL (ref <150)
VLDL: 19 mg/dL (ref 0–40)

## 2014-07-09 LAB — POCT GLYCOSYLATED HEMOGLOBIN (HGB A1C): Hemoglobin A1C: 0

## 2014-07-09 NOTE — Progress Notes (Signed)
   Subjective:    Patient ID: Dylan Callahan, male    DOB: 1956/07/02, 58 y.o.   MRN: [id number]  HPI He is here for a complete examination. He is being followed by Dr. Dierdre ForthBeekman and apparently has been diagnosed as having fibromyalgia and CFS. He was recently tried on Cymbalta but apparently had urinary difficulties as well as ED. He has subsequently stopped that medication but is not on any other medication other than colchicine. He did state that the colchicine has helped with some of his symptoms. He also has a history of cervical disc disease with an MRI in 2011 showing some stenosis. He states he did have some epidurals but they were not successful. He does have etodolac but has not used it due to concerns about cardiac problems. He is quite frustrated over all the aches and pains that he is having as well as difficulty with fatigue. Presently he is not employed and he states this is due to his present illness. He does feel depressed over this. Family and social history was reviewed.   Review of Systems  All other systems reviewed and are negative.      Objective:   Physical Exam BP 120/80 mmHg  Pulse 80  Ht 6' (1.829 m)  Wt 237 lb (107.502 kg)  BMI 32.14 kg/m2  SpO2 99%  General Appearance:    Alert, cooperative, no distress, appears stated age  Head:    Normocephalic, without obvious abnormality, atraumatic  Eyes:    PERRL, conjunctiva/corneas clear, EOM's intact, fundi    benign  Ears:    Normal TM's and external ear canals  Nose:   Nares normal, mucosa normal, no drainage or sinus   tenderness  Throat:   Lips, mucosa, and tongue normal; teeth and gums normal  Neck:   Supple, no lymphadenopathy;  thyroid:  no   enlargement/tenderness/nodules; no carotid   bruit or JVD  Back:    Spine nontender, no curvature, ROM normal, no CVA     tenderness  Lungs:     Clear to auscultation bilaterally without wheezes, rales or     ronchi; respirations unlabored  Chest Wall:    No tenderness  or deformity   Heart:    Regular rate and rhythm, S1 and S2 normal, no murmur, rub   or gallop  Breast Exam:    No chest wall tenderness, masses or gynecomastia  Abdomen:     Soft, non-tender, nondistended, normoactive bowel sounds,    no masses, no hepatosplenomegaly        Extremities:   No clubbing, cyanosis or edema  Pulses:   2+ and symmetric all extremities  Skin:   Skin color, texture, turgor normal, no rashes or lesions  Lymph nodes:   Cervical, supraclavicular, and axillary nodes normal  Neurologic:   CNII-XII intact, normal strength, sensation and gait; reflexes 2+ and symmetric throughout          Psych:   Normal mood, affect, hygiene and grooming.    Blood work from Dr. Dierdre ForthBeekman was CBC and Cmet which was normal.      Assessment & Plan:  Routine general medical examination at a health care facility - Plan: POCT glycosylated hemoglobin (Hb A1C), Lipid panel  Cervical disc disease  Fibromyalgia  Behcet's disease  hemo-globin A1c was not done. It was placed in error.

## 2014-07-10 ENCOUNTER — Telehealth: Payer: Self-pay | Admitting: Internal Medicine

## 2014-07-10 MED ORDER — VENLAFAXINE HCL ER 37.5 MG PO CP24: 37.5000 mg | ORAL_CAPSULE | Freq: Two times a day (BID) | ORAL | Status: DC

## 2014-07-10 NOTE — Telephone Encounter (Signed)
Dr. Susann GivensLalonde called stating that he wanted me to tell pt that Dr. Anson OregonBeckman and him talked and decided to start him on Effexor 37.5mg  BID and that Dr. Anson OregonBeckman will follow-up with him at next appt. i have sent in med to pharmacy

## 2014-07-14 ENCOUNTER — Encounter: Payer: Self-pay | Admitting: Family Medicine

## 2014-07-28 ENCOUNTER — Encounter: Payer: Self-pay | Admitting: Diagnostic Neuroimaging

## 2014-07-28 ENCOUNTER — Ambulatory Visit (INDEPENDENT_AMBULATORY_CARE_PROVIDER_SITE_OTHER): Payer: BC Managed Care – PPO | Admitting: Diagnostic Neuroimaging

## 2014-07-28 VITALS — BP 123/79 | HR 70 | Ht 72.0 in | Wt 242.2 lb

## 2014-07-28 DIAGNOSIS — G894 Chronic pain syndrome: Secondary | ICD-10-CM

## 2014-07-28 DIAGNOSIS — G4700 Insomnia, unspecified: Secondary | ICD-10-CM

## 2014-07-28 DIAGNOSIS — F329 Major depressive disorder, single episode, unspecified: Secondary | ICD-10-CM

## 2014-07-28 DIAGNOSIS — F32A Depression, unspecified: Secondary | ICD-10-CM

## 2014-07-28 DIAGNOSIS — M791 Myalgia, unspecified site: Secondary | ICD-10-CM

## 2014-07-28 DIAGNOSIS — M352 Behcet's disease: Secondary | ICD-10-CM

## 2014-07-28 NOTE — Progress Notes (Signed)
[facility]  PATIENT: Dylan Callahan DOB: [birth year]  REFERRING CLINICIAN: Marquis Buggy HISTORY FROM: patient  REASON FOR VISIT: new consult   HISTORICAL  CHIEF COMPLAINT:  Chief Complaint  Patient presents with  . Follow-up    RM 8  . Behcet's disease    HISTORY OF PRESENT ILLNESS:   UPDATE 07/28/14: Since last visit, mood is slightly better. Pain is stable. Memory stable. Tried cymbalta 78m, but stopped due to side effects. Now planning to try cymbalta 382m but hasn't started yet.   PRIOR HPI (01/29/14): [age]ear old right-handed male with the Behcet's disease, here for evaluation of constellation of symptoms including headache, dizziness, confusion, memory loss, fatigue. In 1985 patient developed onset of flulike symptoms, high fevers, weight loss, oral ulcers and sores, blurred vision, and [place] diagnosed with Behcet's disease and treated with prednisone. He had intermittent flareups over the years. He was never on long-term immunosuppression. He did well for many years until 2011 when he began to have increasing flareups. By 2013, 2014, patient noticed some increasing memory problems. 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 This caused significant stress, anxiety and depression. 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 Patient currently unemployed. He is having significant stress related to these career, legal and financial problems. Patient notes memory loss and confusion with short-term memory loss, losing objects, poor focus, difficulty with multitasking. Patient having generalized fatigue, myalgia, arthralgia pain. Patient having increasing headaches since April 2015 with retro-orbital pain, frontal pain, occipital pain, photophobia. He describes pressure tension-type headache. No  nausea vomiting. No vision changes. Headaches occur daily basis lasting 30-60 minutes.  Patient having significant depression, anxiety and insomnia. Previously was having some suicidal and homicidal thoughts. Currently intermittent suicidal thoughts but he has no specific plan and is able to contract for safety.  REVIEW OF SYSTEMS: Full 14 system review of systems performed and notable only for fatigue blood in urine memory loss walking diff neck pain freq waking rash.    ALLERGIES: No Known Allergies  HOME MEDICATIONS: Outpatient Prescriptions Prior to Visit  Medication Sig Dispense Refill  . aspirin 81 MG tablet Take 81 mg by mouth daily.      . colchicine 0.6 MG tablet Take 1 tablet by mouth 2 (two) times daily.    . vardenafil (LEVITRA) 20 MG tablet Take 1 tablet (20 mg total) by mouth daily as needed for erectile dysfunction. 4 tablet 11  . venlafaxine XR (EFFEXOR XR) 37.5 MG 24 hr capsule Take 1 capsule (37.5 mg total) by mouth 2 (two) times daily. 60 capsule 0   No facility-administered medications prior to visit.    PAST MEDICAL HISTORY: Past Medical History  Diagnosis Date  . Hypertension   . ED (erectile dysfunction)   . Behcet's syndrome     PAST SURGICAL HISTORY: Past Surgical History  Procedure Laterality Date  . Colonoscopy      FAMILY HISTORY: Family History  Problem Relation Age of Onset  . Dementia Mother     SOCIAL HISTORY:  History   Social History  . Marital Status: Single    Spouse Name: N/A    Number of Children: 2  . Years of Education: [employer] Occupational History  . Not on file.   Social History Main Topics  . Smoking status: Never  Smoker   . Smokeless tobacco: Never Used  . Alcohol Use: 8.4 oz/week    14 Glasses of wine per week  . Drug Use: No  . Sexual Activity: Yes   Other Topics Concern  . Not on file   Social History Narrative   Patient is single with 2 children.   Patient is right handed.   Patient has his  Bachelor's degree.   Patient drinks 4 cups daily.     PHYSICAL EXAM  Filed Vitals:   07/28/14 1422  BP: 123/79  Pulse: 70  Height: 6' (1.829 m)  Weight: 242 lb 3.2 oz (109.861 kg)    Not recorded      Body mass index is 32.84 kg/(m^2).  GENERAL EXAM: Patient is in no distress; well developed, nourished and groomed; neck is supple  CARDIOVASCULAR: Regular rate and rhythm, no murmurs, no carotid bruits  NEUROLOGIC: MENTAL STATUS: awake, alert, language fluent, comprehension intact, naming intact, fund of knowledge appropriate CRANIAL NERVE: pupils equal and reactive to light, visual fields full to confrontation, extraocular muscles intact, no nystagmus, facial sensation and strength symmetric, hearing intact, palate elevates symmetrically, uvula midline, shoulder shrug symmetric, tongue midline. MOTOR: normal bulk and tone, full strength in the BUE, BLE SENSORY: normal and symmetric to light touch COORDINATION: finger-nose-finger, fine finger movements normal REFLEXES: deep tendon reflexes present and symmetric GAIT/STATION: narrow based gait; romberg is negative    DIAGNOSTIC DATA (LABS, IMAGING, TESTING) - I reviewed patient records, labs, notes, testing and imaging myself where available.  Lab Results  Component Value Date   WBC 5.5 05/15/2013   HGB 14.0 05/15/2013   HCT 41.4 05/15/2013   MCV 87.2 05/15/2013   PLT 272 05/15/2013      Component Value Date/Time   NA 138 05/15/2013 1518   K 4.1 05/15/2013 1518   CL 106 05/15/2013 1518   CO2 26 05/15/2013 1518   GLUCOSE 96 05/15/2013 1518   BUN 10 05/15/2013 1518   CREATININE 1.09 05/15/2013 1518   CREATININE 1.14 09/15/2008 0620   CALCIUM 9.3 05/15/2013 1518   PROT 7.1 05/15/2013 1518   ALBUMIN 4.1 05/15/2013 1518   AST 16 05/15/2013 1518   ALT 13 05/15/2013 1518   ALKPHOS 68 05/15/2013 1518   BILITOT 0.4 05/15/2013 1518   GFRNONAA >60 09/15/2008 0620   GFRAA  09/15/2008 0620    >60        The eGFR  has been calculated using the MDRD equation. This calculation has not been validated in all clinical situations. eGFR's persistently <60 mL/min signify possible Chronic Kidney Disease.   Lab Results  Component Value Date   CHOL 214* 07/09/2014   HDL 53 07/09/2014   LDLCALC 142* 07/09/2014   TRIG 94 07/09/2014   CHOLHDL 4.0 07/09/2014   Lab Results  Component Value Date   HGBA1C 0.0 07/09/2014   No results found for: VITAMINB12 No results found for: TSH  I reviewed images myself and agree with interpretation. -VRP  01/13/14 MRI brain 1. No evidence of acute intracranial abnormality or mass.  2. Foci of white matter T2 signal abnormality, greater than expected for patient's age and nonspecific. Considerations include small vessel ischemia, sequelae of trauma, hypercoagulable state, vasculitis, migraines, prior infection or demyelination.    ASSESSMENT AND PLAN  58 y.o. year old male here with Behcet's disease, worsening over past year, with other sxs including HA, fatigue, pain, memory loss, depression. Suspect that these other symptoms are related to increasing pain, inflammation, mood  difficulty, sleep problems, life events. MRI brain shows no abnormal enhancing lesions to suggest active neuro Behcet's disease. Patient may benefit from psychiatry and pain mgmt.  Ddx of memory/cognitive issues: depression, anxiety, insomnia, chronic pain, life events (legal, career, financial)  Ddx of headaches: behcet's, tension HA, cervicogenic, depression, insomnia  Ddx of fatigue: same as above   PLAN: - psychiatry/psychology evaluation encouraged; patient will think about - pain management (via rheumatology or pain mgmt clinic) - no active neuro-behcet's based on MRI brain; however, his above symptomatology could be related to systemic behcet's disease effects. Defer to Dr. Amil Amen re: Behcet's long term immunomodulation  Return if symptoms worsen or fail to improve.    Penni Bombard, MD 02/71/4232, 0:09 PM Certified in Neurology, Neurophysiology and Neuroimaging  [facility] [address], [place], [place] [address] [phone]

## 2014-09-12 ENCOUNTER — Telehealth: Payer: Self-pay | Admitting: Internal Medicine

## 2014-09-12 MED ORDER — TADALAFIL 5 MG PO TABS: 5.0000 mg | ORAL_TABLET | Freq: Every day | ORAL | Status: DC | PRN

## 2014-09-12 NOTE — Telephone Encounter (Signed)
Pt would like cialis 5mg  [facility]. Pt is having side effects of levitra with nausea

## 2014-09-15 ENCOUNTER — Telehealth: Payer: Self-pay | Admitting: Family Medicine

## 2014-09-15 NOTE — Telephone Encounter (Signed)
Please call re prior authorization for  Cialis 5 mg   [facility] [address]  [facility]

## 2014-09-22 NOTE — Telephone Encounter (Signed)
Initiated P.A. Cialis 

## 2014-09-23 MED ORDER — VARDENAFIL HCL 20 MG PO TABS: 20.0000 mg | ORAL_TABLET | Freq: Every day | ORAL | Status: DC | PRN

## 2014-09-23 NOTE — Telephone Encounter (Signed)
Pt called back & states insurance will pay for Levitra 20mg  #4 a month,  Please send in RX for this with refills

## 2014-09-23 NOTE — Telephone Encounter (Signed)
P.A. CIALIS denied, pt must have BPH also failure of 4 weeks of alpha-adrenergic blocking medication.  Called pt & [facility] & see if they will pay for Levitra or Viagra and call me back

## 2014-12-30 ENCOUNTER — Telehealth: Payer: Self-pay | Admitting: Internal Medicine

## 2014-12-30 NOTE — Telephone Encounter (Signed)
Pt called in stating he needed a referral to Dr. Dierdre ForthBeekman office. We referred him there but he has a new insurance that requires a referral. Pt is there now  [facility] [id number] Dr. Alben DeedsJames Beekman @ [facility] DX code- M35.2, M79.7 Fax  # [phone]    Referral was done online and [facility]

## 2015-01-06 ENCOUNTER — Encounter: Payer: Self-pay | Admitting: Family Medicine

## 2015-03-13 ENCOUNTER — Ambulatory Visit (INDEPENDENT_AMBULATORY_CARE_PROVIDER_SITE_OTHER): Payer: 59 | Admitting: Family Medicine

## 2015-03-13 ENCOUNTER — Encounter: Payer: Self-pay | Admitting: Family Medicine

## 2015-03-13 VITALS — BP 120/78 | HR 74 | Wt 236.0 lb

## 2015-03-13 DIAGNOSIS — M352 Behcet's disease: Secondary | ICD-10-CM

## 2015-03-13 DIAGNOSIS — M5090 Cervical disc disorder, unspecified, unspecified cervical region: Secondary | ICD-10-CM

## 2015-03-13 DIAGNOSIS — E291 Testicular hypofunction: Secondary | ICD-10-CM | POA: Diagnosis not present

## 2015-03-13 DIAGNOSIS — R7989 Other specified abnormal findings of blood chemistry: Secondary | ICD-10-CM

## 2015-03-13 DIAGNOSIS — M25562 Pain in left knee: Secondary | ICD-10-CM | POA: Diagnosis not present

## 2015-03-13 DIAGNOSIS — N528 Other male erectile dysfunction: Secondary | ICD-10-CM

## 2015-03-13 NOTE — Progress Notes (Signed)
   Subjective:    Patient ID: Dylan Callahan, male    DOB: Feb 21, 1956, 59 y.o.   MRN: [id number]  HPI He is here for consult concerning a low testosterone. He did have blood work done by Dr. Dierdre Forth which did show a low testosterone. He is being followed therefore Behcet's disease .He does complain of decreased libido, energy and strength. He does have underlying ED and does use Cialis; he has also used Levitra in the past. He is also having some related symptoms in terms of occasional urgency and on least one occasion, and continence. He also complains of left knee pain. Apparently he did have an injection in the knee and would like to return to the orthopedic surgeon did this. He also complains of some neck discomfort and does have a previous history of cervical disc disease.   Review of Systems     Objective:   Physical Exam Alert and in no distress otherwise not examined       Assessment & Plan:  Cervical disc disease - Plan: Ambulatory referral to Orthopedic Surgery  Behcet's disease  Low testosterone - Plan: Testosterone, PSA  Left knee pain - Plan: Ambulatory referral to Orthopedic Surgery  Other male erectile dysfunction  I discussed low testosterone with him and its effect on energy, stamina and libido. He will continue on his present the deep medication. I will refer him to orthopedics for follow-up on the knee and neck.

## 2015-03-14 LAB — PSA: PSA: 0.29 ng/mL (ref <=4.00)

## 2015-03-14 LAB — TESTOSTERONE: TESTOSTERONE: 391 ng/dL (ref 300–890)

## 2015-03-30 ENCOUNTER — Telehealth: Payer: Self-pay | Admitting: Family Medicine

## 2015-03-30 MED ORDER — SILDENAFIL CITRATE 20 MG PO TABS: 20.0000 mg | ORAL_TABLET | ORAL | Status: DC | PRN

## 2015-03-30 NOTE — Telephone Encounter (Signed)
Pt states he's having to pay out of pocket for all ED meds.  He request Sildenafil  #30 sent to [facility] which should cost $45 out of pocket

## 2015-06-04 ENCOUNTER — Encounter: Payer: Self-pay | Admitting: Family Medicine

## 2015-06-04 ENCOUNTER — Ambulatory Visit (INDEPENDENT_AMBULATORY_CARE_PROVIDER_SITE_OTHER): Payer: 59 | Admitting: Family Medicine

## 2015-06-04 DIAGNOSIS — S39012A Strain of muscle, fascia and tendon of lower back, initial encounter: Secondary | ICD-10-CM

## 2015-06-04 MED ORDER — CARISOPRODOL 350 MG PO TABS
350.0000 mg | ORAL_TABLET | Freq: Three times a day (TID) | ORAL | Status: DC | PRN
Start: 2015-06-04 — End: 2015-07-24

## 2015-06-04 NOTE — Patient Instructions (Signed)
Take 4 Advil 3 times per day. Gentle stretching. Start using heat for 20 minutes 3 times a day after about 3 days
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
## 2015-06-18 ENCOUNTER — Ambulatory Visit (INDEPENDENT_AMBULATORY_CARE_PROVIDER_SITE_OTHER): Payer: 59 | Admitting: Family Medicine

## 2015-06-18 ENCOUNTER — Encounter: Payer: Self-pay | Admitting: Family Medicine

## 2015-06-18 VITALS — BP 120/80 | HR 78 | Ht 71.0 in | Wt 242.0 lb

## 2015-06-18 DIAGNOSIS — Z23 Encounter for immunization: Secondary | ICD-10-CM | POA: Diagnosis not present

## 2015-06-18 DIAGNOSIS — F329 Major depressive disorder, single episode, unspecified: Secondary | ICD-10-CM | POA: Diagnosis not present

## 2015-06-18 DIAGNOSIS — S39012A Strain of muscle, fascia and tendon of lower back, initial encounter: Secondary | ICD-10-CM

## 2015-06-18 DIAGNOSIS — F32A Depression, unspecified: Secondary | ICD-10-CM

## 2015-06-18 MED ORDER — VENLAFAXINE HCL ER 37.5 MG PO CP24: 37.5000 mg | ORAL_CAPSULE | Freq: Every day | ORAL | Status: DC

## 2015-06-18 NOTE — Patient Instructions (Signed)
Call me in 2 weeks and let me know how you are doing
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
## 2015-06-25 ENCOUNTER — Telehealth: Payer: Self-pay

## 2015-06-25 NOTE — Telephone Encounter (Signed)
Pt called syaing you referred him to Dr. Dierdre ForthBeekman, but [facility] is [facility] at [facility], [facility] @ [address]. [facility], his appt is tomorrow, he just needs a referral to that specific office so he can be seen when he goes.

## 2015-06-25 NOTE — Telephone Encounter (Signed)
I have faxed the referral to [phone]

## 2015-06-25 NOTE — Telephone Encounter (Signed)
Take care of this 

## 2015-07-20 ENCOUNTER — Ambulatory Visit: Payer: 59 | Admitting: Family Medicine

## 2015-07-24 ENCOUNTER — Ambulatory Visit (INDEPENDENT_AMBULATORY_CARE_PROVIDER_SITE_OTHER): Payer: 59 | Admitting: Family Medicine

## 2015-07-24 ENCOUNTER — Telehealth: Payer: Self-pay | Admitting: Family Medicine

## 2015-07-24 ENCOUNTER — Encounter: Payer: Self-pay | Admitting: Family Medicine

## 2015-07-24 VITALS — BP 128/86 | HR 66 | Resp 12 | Ht 72.0 in | Wt 240.6 lb

## 2015-07-24 DIAGNOSIS — Z23 Encounter for immunization: Secondary | ICD-10-CM

## 2015-07-24 DIAGNOSIS — F329 Major depressive disorder, single episode, unspecified: Secondary | ICD-10-CM | POA: Diagnosis not present

## 2015-07-24 DIAGNOSIS — Z0000 Encounter for general adult medical examination without abnormal findings: Secondary | ICD-10-CM | POA: Diagnosis not present

## 2015-07-24 DIAGNOSIS — H547 Unspecified visual loss: Secondary | ICD-10-CM | POA: Diagnosis not present

## 2015-07-24 DIAGNOSIS — R4702 Dysphasia: Secondary | ICD-10-CM | POA: Diagnosis not present

## 2015-07-24 DIAGNOSIS — Z86010 Personal history of colonic polyps: Secondary | ICD-10-CM | POA: Diagnosis not present

## 2015-07-24 DIAGNOSIS — M352 Behcet's disease: Secondary | ICD-10-CM | POA: Diagnosis not present

## 2015-07-24 DIAGNOSIS — Z1159 Encounter for screening for other viral diseases: Secondary | ICD-10-CM | POA: Diagnosis not present

## 2015-07-24 DIAGNOSIS — F32A Depression, unspecified: Secondary | ICD-10-CM

## 2015-07-24 LAB — COMPREHENSIVE METABOLIC PANEL
ALT: 18 U/L (ref 9–46)
AST: 22 U/L (ref 10–35)
Albumin: 4.1 g/dL (ref 3.6–5.1)
Alkaline Phosphatase: 66 U/L (ref 40–115)
BUN: 8 mg/dL (ref 7–25)
CALCIUM: 9 mg/dL (ref 8.6–10.3)
CHLORIDE: 104 mmol/L (ref 98–110)
CO2: 26 mmol/L (ref 20–31)
Creat: 1.17 mg/dL (ref 0.70–1.25)
GLUCOSE: 92 mg/dL (ref 65–99)
POTASSIUM: 3.8 mmol/L (ref 3.5–5.3)
Sodium: 140 mmol/L (ref 135–146)
Total Bilirubin: 0.4 mg/dL (ref 0.2–1.2)
Total Protein: 6.7 g/dL (ref 6.1–8.1)

## 2015-07-24 LAB — CBC WITH DIFFERENTIAL/PLATELET
BASOS ABS: 0 10*3/uL (ref 0.0–0.1)
Basophils Relative: 1 % (ref 0–1)
EOS ABS: 0.2 10*3/uL (ref 0.0–0.7)
Eosinophils Relative: 4 % (ref 0–5)
HCT: 39.8 % (ref 39.0–52.0)
Hemoglobin: 13.9 g/dL (ref 13.0–17.0)
LYMPHS PCT: 47 % — AB (ref 12–46)
Lymphs Abs: 2.2 10*3/uL (ref 0.7–4.0)
MCH: 30.3 pg (ref 26.0–34.0)
MCHC: 34.9 g/dL (ref 30.0–36.0)
MCV: 86.7 fL (ref 78.0–100.0)
MONO ABS: 0.3 10*3/uL (ref 0.1–1.0)
MONOS PCT: 7 % (ref 3–12)
MPV: 11 fL (ref 8.6–12.4)
Neutro Abs: 1.9 10*3/uL (ref 1.7–7.7)
Neutrophils Relative %: 41 % — ABNORMAL LOW (ref 43–77)
PLATELETS: 208 10*3/uL (ref 150–400)
RBC: 4.59 MIL/uL (ref 4.22–5.81)
RDW: 14 % (ref 11.5–15.5)
WBC: 4.6 10*3/uL (ref 4.0–10.5)

## 2015-07-24 LAB — POCT URINALYSIS DIPSTICK
Bilirubin, UA: NEGATIVE
GLUCOSE UA: NEGATIVE
KETONES UA: NEGATIVE
Leukocytes, UA: NEGATIVE
Nitrite, UA: NEGATIVE
Protein, UA: NEGATIVE
Urobilinogen, UA: NEGATIVE
pH, UA: 5.5

## 2015-07-24 LAB — LIPID PANEL
CHOL/HDL RATIO: 3.9 ratio (ref <=5.0)
Cholesterol: 186 mg/dL (ref 125–200)
HDL: 48 mg/dL (ref >=40)
LDL Cholesterol: 121 mg/dL (ref <130)
Triglycerides: 84 mg/dL (ref <150)
VLDL: 17 mg/dL (ref <30)

## 2015-07-24 NOTE — Progress Notes (Signed)
Subjective:    Patient ID: Dylan Callahan, male    DOB: [birth year], 59 y.o.   MRN: [id number]  HPI  he is here for complete examination. He does complain of difficulty with , solids more than liquids. He is intermittent in nature stating it concur several times per week but does not occur with every meal. He has had previous colonoscopy done by Dr. Kinnie Scales.  History of colonic polyps.He also complains of left hip pain and states it is from walking on cement. He then when on to complain of neck and knee and general aches and pains. He has discussed this with his rheumatologist, Dr. Dierdre Forth. [place] a complains of continued difficulty with memory loss, sleep disturbance , mood swings and pain. He has been tried on Cymbalta in the past but apparently it made him sick. He has not been tried on Effexor. Presently he is not working and apparently because of the pain that he is under. He's had no chest pain, shortness of breath,   Review of Systems  All other systems reviewed and are negative.      Objective:   Physical Exam BP 128/86 mmHg  Pulse 66  Resp 12  Ht 6' (1.829 m)  Wt 240 lb 9.6 oz (109.135 kg)  BMI 32.62 kg/m2  General Appearance:    Alert, cooperative, no distress, appears stated age  Head:    Normocephalic, without obvious abnormality, atraumatic  Eyes:    PERRL, conjunctiva/corneas clear, EOM's intact, fundi      difficult to see  Ears:    Normal TM's and external ear canals  Nose:   Nares normal, mucosa normal, no drainage or sinus   tenderness  Throat:   Lips, mucosa, and tongue normal; teeth and gums normal  Neck:   Supple, no lymphadenopathy;  thyroid:  no   enlargement/tenderness/nodules; no carotid   bruit or JVD  Back:    Spine nontender, no curvature, ROM normal, no CVA     tenderness  Lungs:     Clear to auscultation bilaterally without wheezes, rales or     ronchi; respirations unlabored  Chest Wall:    No tenderness or deformity   Heart:    Regular rate and rhythm,  S1 and S2 normal, no murmur, rub   or gallop  Breast Exam:    No chest wall tenderness, masses or gynecomastia  Abdomen:     Soft, non-tender, nondistended, normoactive bowel sounds,    no masses, no hepatosplenomegaly        Extremities:   No clubbing, cyanosis or edema  Pulses:   2+ and symmetric all extremities  Skin:   Skin color, texture, turgor normal, no rashes or lesions  Lymph nodes:   Cervical, supraclavicular, and axillary nodes normal  Neurologic:   CNII-XII intact, normal strength, sensation and gait; reflexes 2+ and symmetric throughout          Psych:    flat mood andaffect,  Normal hygiene and grooming.          Assessment & Plan:  Routine general medical examination at a health care facility - Plan: CBC with Differential/Platelet, Comprehensive metabolic panel, Lipid panel, POCT urinalysis dipstick  Dysphasia - Plan: DG Esophagus  Need for Zostavax administration - Plan: Varicella-zoster vaccine subcutaneous  Need for hepatitis C screening test - Plan: Hepatitis C antibody  Decreased visual acuity - Plan: Ambulatory referral to Ophthalmology  Behcet's syndrome [facility]) - Plan: CBC with Differential/Platelet, Comprehensive metabolic panel  Personal  history of colonic polyps  Depression  enema for a barium swallow to check his dysphagia. He will be referred to ophthalmology because of the visual acuity rate he has 7020 and his right eye. He certainly has symptoms of depression but at this point he states he is on willing to get counseling or go on medication. I did mention the use of Effexor to him.

## 2015-07-24 NOTE — Telephone Encounter (Signed)
Pt called and stated that at today's visit he was unsure of medication he need refilled. He called back with name of medicine. He needs Triamcinolone cream .1% and an eye ointment. Per JCL eye ointment must be filled by an eye doctor. Pt was informed. Please refill cream to [facility] [address]

## 2015-07-25 LAB — HEPATITIS C ANTIBODY: HCV AB: NEGATIVE

## 2015-07-25 MED ORDER — TRIAMCINOLONE ACETONIDE 0.1 % EX CREA: 1.0000 "application " | TOPICAL_CREAM | Freq: Two times a day (BID) | CUTANEOUS | Status: DC

## 2015-08-07 ENCOUNTER — Encounter: Payer: Self-pay | Admitting: Family Medicine

## 2015-08-07 DIAGNOSIS — H269 Unspecified cataract: Secondary | ICD-10-CM

## 2015-10-20 ENCOUNTER — Ambulatory Visit: Payer: Self-pay | Admitting: Diagnostic Neuroimaging

## 2015-12-10 ENCOUNTER — Telehealth: Payer: Self-pay | Admitting: Diagnostic Neuroimaging

## 2015-12-10 NOTE — Telephone Encounter (Signed)
Jaleesa/[facility] [phone] called to confirm receipt of medical records release faxed 10/26/15 and 12/10/15.

## 2016-06-14 ENCOUNTER — Other Ambulatory Visit: Payer: Self-pay | Admitting: Family Medicine

## 2016-06-14 NOTE — Telephone Encounter (Signed)
Is this okay to refill? 

## 2016-06-15 ENCOUNTER — Other Ambulatory Visit: Payer: Self-pay | Admitting: Family Medicine

## 2016-07-14 ENCOUNTER — Other Ambulatory Visit: Payer: Self-pay | Admitting: Family Medicine

## 2016-07-14 NOTE — Telephone Encounter (Signed)
Is this okay to refill? 

## 2017-09-29 ENCOUNTER — Other Ambulatory Visit: Payer: Self-pay | Admitting: Family Medicine

## 2017-09-29 NOTE — Telephone Encounter (Signed)
Is this okay to refill? 

## 2019-04-19 ENCOUNTER — Other Ambulatory Visit: Payer: Self-pay | Admitting: *Deleted

## 2019-04-19 DIAGNOSIS — Z20822 Contact with and (suspected) exposure to covid-19: Secondary | ICD-10-CM

## 2019-04-21 LAB — NOVEL CORONAVIRUS, NAA: SARS-CoV-2, NAA: NOT DETECTED

## 2019-10-08 ENCOUNTER — Other Ambulatory Visit: Payer: No Typology Code available for payment source

## 2019-10-14 ENCOUNTER — Ambulatory Visit: Payer: No Typology Code available for payment source

## 2019-10-17 ENCOUNTER — Ambulatory Visit: Payer: Medicare Other | Attending: Internal Medicine

## 2019-10-17 DIAGNOSIS — Z23 Encounter for immunization: Secondary | ICD-10-CM

## 2019-10-17 NOTE — Progress Notes (Signed)
   Covid-19 Vaccination Clinic  Name:  Dylan Callahan    MRN: [id number] DOB: Feb 04, 1956  10/17/2019  Dylan Callahan was observed post Covid-19 immunization for 15 minutes without incident. He was provided with Vaccine Information Sheet and instruction to access the V-Safe system.   Dylan Callahan was instructed to call 911 with any severe reactions post vaccine: Marland Kitchen Difficulty breathing  . Swelling of face and throat  . A fast heartbeat  . A bad rash all over body  . Dizziness and weakness   Immunizations Administered    Name Date Dose VIS Date Route   Pfizer COVID-19 Vaccine 10/17/2019  8:25 AM 0.3 mL 07/19/2019 Intramuscular   Manufacturer: [employer], [employer]   Lot: [id number]   NDC: [address]-8

## 2019-11-11 ENCOUNTER — Ambulatory Visit: Payer: Medicare Other | Attending: Internal Medicine

## 2019-11-11 DIAGNOSIS — Z23 Encounter for immunization: Secondary | ICD-10-CM

## 2019-11-11 NOTE — Progress Notes (Signed)
   Covid-19 Vaccination Clinic  Name:  Dylan Callahan    MRN: [id number] DOB: 1955/09/12  11/11/2019  Dylan Callahan was observed post Covid-19 immunization for 15 minutes without incident. He was provided with Vaccine Information Sheet and instruction to access the V-Safe system.   Dylan Callahan was instructed to call 911 with any severe reactions post vaccine: Marland Kitchen Difficulty breathing  . Swelling of face and throat  . A fast heartbeat  . A bad rash all over body  . Dizziness and weakness   Immunizations Administered    Name Date Dose VIS Date Route   Pfizer COVID-19 Vaccine 11/11/2019  9:57 AM 0.3 mL 07/19/2019 Intramuscular   Manufacturer: [employer], [employer]   Lot: [id number]   NDC: [address]-9

## 2019-11-26 ENCOUNTER — Other Ambulatory Visit: Payer: Self-pay

## 2019-11-26 DIAGNOSIS — R413 Other amnesia: Secondary | ICD-10-CM

## 2020-01-23 LAB — HM COLONOSCOPY

## 2020-01-27 DIAGNOSIS — K5730 Diverticulosis of large intestine without perforation or abscess without bleeding: Secondary | ICD-10-CM | POA: Insufficient documentation

## 2020-01-28 ENCOUNTER — Encounter: Payer: Self-pay | Admitting: Family Medicine

## 2020-05-13 ENCOUNTER — Ambulatory Visit: Payer: Medicare Other | Admitting: Pulmonary Disease

## 2020-05-13 ENCOUNTER — Encounter: Payer: Self-pay | Admitting: Pulmonary Disease

## 2020-05-13 ENCOUNTER — Other Ambulatory Visit: Payer: Self-pay

## 2020-05-13 DIAGNOSIS — F5104 Psychophysiologic insomnia: Secondary | ICD-10-CM

## 2020-05-13 DIAGNOSIS — G4700 Insomnia, unspecified: Secondary | ICD-10-CM | POA: Insufficient documentation
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
## 2020-05-13 NOTE — Assessment & Plan Note (Signed)
insomnia of sleep maintenance -with early morning awakenings.  No evidence of depression or arthralgias causing him to wake up. Consider slight delay in your bedtime between 10-10 30 p.m.  Consider trial of melatonin 3 mg at bedtime.  Schedule home sleep test due to red flags of gasping episodes that wake him up from sleep to rule out obstructive sleep apnea

## 2020-05-13 NOTE — Patient Instructions (Signed)
  You have insomnia of sleep maintenance -with early morning awakenings. Consider slight delay in your bedtime between 10-10 30 p.m.  Consider trial of melatonin 3 mg at bedtime.  Schedule home sleep test

## 2020-06-15 ENCOUNTER — Ambulatory Visit: Payer: No Typology Code available for payment source

## 2020-06-15 ENCOUNTER — Other Ambulatory Visit: Payer: Self-pay

## 2020-06-15 DIAGNOSIS — F5104 Psychophysiologic insomnia: Secondary | ICD-10-CM

## 2020-06-15 DIAGNOSIS — G4733 Obstructive sleep apnea (adult) (pediatric): Secondary | ICD-10-CM | POA: Diagnosis not present

## 2020-06-16 ENCOUNTER — Telehealth: Payer: Self-pay | Admitting: Pulmonary Disease

## 2020-06-16 DIAGNOSIS — F5104 Psychophysiologic insomnia: Secondary | ICD-10-CM

## 2020-06-16 DIAGNOSIS — G4733 Obstructive sleep apnea (adult) (pediatric): Secondary | ICD-10-CM | POA: Diagnosis not present

## 2020-06-16 NOTE — Telephone Encounter (Signed)
HST showed Very mild OSA with AHI 6/ hr This is not severe enough to require CPAP therapy.  Events were mostly noted in the supine position so he should avoid sleeping on his back if possible.  Oxygen level dropped mildly and we can pursue this further with a nocturnal oximetry to see if he would qualify for oxygen.  Otherwise we can just observe and see how he does and reassess in 6 months

## 2020-06-17 NOTE — Telephone Encounter (Signed)
Called and went over HST results per Dr Vassie Loll with patient. All questions answered and patient expressed understanding of results and Dr Reginia Naas recommendations. Patient agreeable to nocturnal oximetry being ordered and ok with testing if his insurance covers it. Order placed per Dr Vassie Loll. Nothing further needed at this time.

## 2020-07-13 ENCOUNTER — Telehealth: Payer: Self-pay | Admitting: Pulmonary Disease

## 2020-07-13 NOTE — Telephone Encounter (Signed)
Called and went over ONO results per Dr Vassie Loll with patient. All questions answered and patient expressed full understanding. Recall placed for 6 month office visit with NP per patient request due to patient not able to schedule this far in advance at this time. Nothing further needed at this time.

## 2020-07-13 NOTE — Telephone Encounter (Signed)
ONO (4a- 9a ) showed mild desaturation with sat <**% OK to only observe for now FU OV in 6 months with APP please

## 2021-02-17 ENCOUNTER — Other Ambulatory Visit: Payer: Self-pay | Admitting: Internal Medicine

## 2021-02-17 DIAGNOSIS — I10 Essential (primary) hypertension: Secondary | ICD-10-CM

## 2021-02-17 MED ORDER — AMLODIPINE BESYLATE 10 MG PO TABS: 10.0000 mg | ORAL_TABLET | Freq: Every day | ORAL | 0 refills | Status: DC

## 2021-02-24 ENCOUNTER — Ambulatory Visit (INDEPENDENT_AMBULATORY_CARE_PROVIDER_SITE_OTHER): Payer: Medicare Other | Admitting: Internal Medicine

## 2021-02-24 ENCOUNTER — Encounter: Payer: Self-pay | Admitting: Internal Medicine

## 2021-02-24 ENCOUNTER — Other Ambulatory Visit: Payer: Self-pay

## 2021-02-24 VITALS — BP 128/86 | HR 69 | Temp 98.4°F | Resp 16 | Ht 72.0 in | Wt 241.0 lb

## 2021-02-24 DIAGNOSIS — F5104 Psychophysiologic insomnia: Secondary | ICD-10-CM

## 2021-02-24 DIAGNOSIS — E785 Hyperlipidemia, unspecified: Secondary | ICD-10-CM | POA: Diagnosis not present

## 2021-02-24 DIAGNOSIS — I10 Essential (primary) hypertension: Secondary | ICD-10-CM | POA: Diagnosis not present

## 2021-02-24 DIAGNOSIS — Z0001 Encounter for general adult medical examination with abnormal findings: Secondary | ICD-10-CM

## 2021-02-24 DIAGNOSIS — Z125 Encounter for screening for malignant neoplasm of prostate: Secondary | ICD-10-CM

## 2021-02-24 DIAGNOSIS — Z23 Encounter for immunization: Secondary | ICD-10-CM | POA: Insufficient documentation

## 2021-02-24 LAB — CBC WITH DIFFERENTIAL/PLATELET
Basophils Absolute: 0.1 10*3/uL (ref 0.0–0.1)
Basophils Relative: 1.1 % (ref 0.0–3.0)
Eosinophils Absolute: 0.1 10*3/uL (ref 0.0–0.7)
Eosinophils Relative: 1.6 % (ref 0.0–5.0)
HCT: 41.1 % (ref 39.0–52.0)
Hemoglobin: 13.9 g/dL (ref 13.0–17.0)
Lymphocytes Relative: 34.7 % (ref 12.0–46.0)
Lymphs Abs: 1.6 10*3/uL (ref 0.7–4.0)
MCHC: 33.8 g/dL (ref 30.0–36.0)
MCV: 89.3 fl (ref 78.0–100.0)
Monocytes Absolute: 0.4 10*3/uL (ref 0.1–1.0)
Monocytes Relative: 9.1 % (ref 3.0–12.0)
Neutro Abs: 2.5 10*3/uL (ref 1.4–7.7)
Neutrophils Relative %: 53.5 % (ref 43.0–77.0)
Platelets: 242 10*3/uL (ref 150.0–400.0)
RBC: 4.6 Mil/uL (ref 4.22–5.81)
RDW: 13.6 % (ref 11.5–15.5)
WBC: 4.7 10*3/uL (ref 4.0–10.5)

## 2021-02-24 LAB — LIPID PANEL
Cholesterol: 183 mg/dL (ref 0–200)
HDL: 45.1 mg/dL (ref >39.00)
LDL Cholesterol: 120 mg/dL — ABNORMAL HIGH (ref 0–99)
NonHDL: 138.39
Total CHOL/HDL Ratio: 4
Triglycerides: 91 mg/dL (ref 0.0–149.0)
VLDL: 18.2 mg/dL (ref 0.0–40.0)

## 2021-02-24 LAB — BASIC METABOLIC PANEL
BUN: 7 mg/dL (ref 6–23)
CO2: 27 mEq/L (ref 19–32)
Calcium: 9.1 mg/dL (ref 8.4–10.5)
Chloride: 105 mEq/L (ref 96–112)
Creatinine, Ser: 1.17 mg/dL (ref 0.40–1.50)
GFR: 65.01 mL/min (ref >60.00)
Glucose, Bld: 102 mg/dL — ABNORMAL HIGH (ref 70–99)
Potassium: 4 mEq/L (ref 3.5–5.1)
Sodium: 140 mEq/L (ref 135–145)

## 2021-02-24 LAB — URINALYSIS, ROUTINE W REFLEX MICROSCOPIC
Bilirubin Urine: NEGATIVE
Ketones, ur: NEGATIVE
Leukocytes,Ua: NEGATIVE
Nitrite: NEGATIVE
Specific Gravity, Urine: 1.025 (ref 1.000–1.030)
Total Protein, Urine: NEGATIVE
Urine Glucose: NEGATIVE
Urobilinogen, UA: 0.2 (ref 0.0–1.0)
pH: 6 (ref 5.0–8.0)

## 2021-02-24 MED ORDER — SHINGRIX 50 MCG/0.5ML IM SUSR: 0.5000 mL | Freq: Once | INTRAMUSCULAR | 1 refills | Status: AC

## 2021-02-24 MED ORDER — BELSOMRA 15 MG PO TABS: 15.0000 mg | ORAL_TABLET | Freq: Every evening | ORAL | 1 refills | Status: AC | PRN

## 2021-02-24 NOTE — Progress Notes (Signed)
Subjective:  Patient ID: Dylan Callahan, male    DOB: 1955/11/15  Age: 65 y.o. MRN: [id number]  CC: New Patient (Initial Visit), Annual Exam, and Hypertension  This visit occurred during the SARS-CoV-2 public health emergency.  Safety protocols were in place, including screening questions prior to the visit, additional usage of staff PPE, and extensive cleaning of exam room while observing appropriate contact time as indicated for disinfecting solutions.    HPI BURLIE CAJAMARCA presents for a CPX, f/up, and to establish.  He complains of chronic insomnia with frequent awakenings and difficulty falling asleep.  He has tried melatonin without any improvement in the symptoms.  He is being evaluated for sleep apnea.  He has a history of high blood pressure.  He tells me his blood pressures recently been well controlled.  He is active and denies any recent episodes of chest pain, shortness of breath, diaphoresis, dizziness, lightheadedness, edema, or fatigue.  History Zackarey has a past medical history of Arthritis, Behcet's syndrome (HCC), Chronic fatigue syndrome, ED (erectile dysfunction), and Hypertension.   He has a past surgical history that includes Colonoscopy; Back surgery; Meniscus repair; and Hemorroidectomy.   His family history includes Dementia in his mother.He reports that he has never smoked. He has never used smokeless tobacco. He reports previous alcohol use of about 21.0 standard drinks of alcohol per week. He reports that he does not use drugs.  Outpatient Medications Prior to Visit  Medication Sig Dispense Refill   amLODipine (NORVASC) 10 MG tablet Take 1 tablet (10 mg total) by mouth daily. 90 tablet 0   aspirin 81 MG tablet Take 81 mg by mouth daily.       pantoprazole (PROTONIX) 40 MG tablet Take 40 mg by mouth daily.     tadalafil (CIALIS) 20 MG tablet Take 20 mg by mouth daily as needed.     meloxicam (MOBIC) 15 MG tablet Take 15 mg by mouth daily. (Patient not taking:  Reported on 02/24/2021)     sildenafil (REVATIO) 20 MG tablet TAKE 1 TABLET BY MOUTH DAILY AS NEEDED (Patient not taking: Reported on 02/24/2021) 60 tablet 1   No facility-administered medications prior to visit.    ROS Review of Systems  Constitutional:  Negative for diaphoresis, fatigue and unexpected weight change.  HENT: Negative.    Eyes: Negative.   Respiratory:  Negative for cough, chest tightness, shortness of breath and wheezing.   Cardiovascular:  Negative for chest pain, palpitations and leg swelling.  Gastrointestinal:  Negative for abdominal pain, constipation, diarrhea, nausea and vomiting.  Endocrine: Negative.   Genitourinary: Negative.  Negative for difficulty urinating and testicular pain.  Musculoskeletal:  Positive for arthralgias. Negative for myalgias.  Skin: Negative.   Neurological: Negative.  Negative for dizziness, weakness, light-headedness and numbness.  Hematological:  Negative for adenopathy. Does not bruise/bleed easily.  Psychiatric/Behavioral: Negative.     Objective:  BP 128/86 (BP Location: Left Arm, Patient Position: Sitting, Cuff Size: Large)   Pulse 69   Temp 98.4 F (36.9 C) (Oral)   Resp 16   Ht 6' (1.829 m)   Wt 241 lb (109.3 kg)   SpO2 98%   BMI 32.69 kg/m   Physical Exam Vitals reviewed.  Constitutional:      Appearance: Normal appearance.  HENT:     Nose: Nose normal.     Mouth/Throat:     Mouth: Mucous membranes are moist.  Eyes:     Conjunctiva/sclera: Conjunctivae normal.  Cardiovascular:  Rate and Rhythm: Normal rate and regular rhythm.     Heart sounds: Normal heart sounds, S1 normal and S2 normal. No murmur heard.    Comments: EKG- NSR, 69 bpm No LVH Normal EKG Pulmonary:     Effort: Pulmonary effort is normal.     Breath sounds: No stridor. No wheezing, rhonchi or rales.  Abdominal:     General: Abdomen is protuberant. Bowel sounds are normal. There is no distension.     Palpations: Abdomen is soft. There is  no hepatomegaly, splenomegaly or mass.     Tenderness: There is no abdominal tenderness.  Genitourinary:    Comments: He refused a GU/rectal exam Musculoskeletal:        General: No swelling.     Cervical back: Neck supple.     Right lower leg: No edema.     Left lower leg: No edema.  Lymphadenopathy:     Cervical: No cervical adenopathy.  Skin:    General: Skin is warm and dry.  Neurological:     General: No focal deficit present.     Mental Status: He is alert.  Psychiatric:        Mood and Affect: Mood normal.        Behavior: Behavior normal.   Lab Results  Component Value Date   WBC 4.7 02/24/2021   HGB 13.9 02/24/2021   HCT 41.1 02/24/2021   PLT 242.0 02/24/2021   GLUCOSE 102 (H) 02/24/2021   CHOL 183 02/24/2021   TRIG 91.0 02/24/2021   HDL 45.10 02/24/2021   LDLCALC 120 (H) 02/24/2021   ALT 18 07/24/2015   AST 22 07/24/2015   NA 140 02/24/2021   K 4.0 02/24/2021   CL 105 02/24/2021   CREATININE 1.17 02/24/2021   BUN 7 02/24/2021   CO2 27 02/24/2021   TSH 0.68 02/24/2021   PSA 0.31 02/25/2021   HGBA1C 0.0 07/09/2014     Assessment & Plan:   Davian was seen today for new patient (initial visit), annual exam and hypertension.  Diagnoses and all orders for this visit:  Encounter for general adult medical examination with abnormal findings- Exam completed, labs reviewed, vaccines reviewed and updated, cancer screenings are up-to-date, patient education was given.  Primary hypertension- His blood pressure is well controlled.  His EKG is reassuring.  Will continue the current antihypertensives. -     EKG 12-Lead -     CBC with Differential/Platelet; Future -     Basic metabolic panel; Future -     Urinalysis, Routine w reflex microscopic; Future -     Urinalysis, Routine w reflex microscopic -     Basic metabolic panel -     CBC with Differential/Platelet  Screening for prostate cancer -     PSA; Future  Hyperlipidemia with target LDL less than 130- His  ASCVD risk score is 30%.  I recommended that he take a statin for CV risk reduction. -     Lipid panel; Future -     TSH; Future -     TSH -     Lipid panel -     rosuvastatin (CRESTOR) 10 MG tablet; Take 1 tablet (10 mg total) by mouth daily.  Psychophysiological insomnia -     Suvorexant (BELSOMRA) 15 MG TABS; Take 15 mg by mouth at bedtime as needed. -     TSH; Future -     TSH  Need for shingles vaccine -     Zoster Vaccine Adjuvanted [facility])  injection; Inject 0.5 mLs into the muscle once for 1 dose.  I have discontinued Hideo A. Tackitt's sildenafil and meloxicam. I am also having him start on Belsomra, Shingrix, and rosuvastatin. Additionally, I am having him maintain his aspirin, pantoprazole, tadalafil, and amLODipine.  Meds ordered this encounter  Medications   Suvorexant (BELSOMRA) 15 MG TABS    Sig: Take 15 mg by mouth at bedtime as needed.    Dispense:  90 tablet    Refill:  1   Zoster Vaccine Adjuvanted [facility]) injection    Sig: Inject 0.5 mLs into the muscle once for 1 dose.    Dispense:  0.5 mL    Refill:  1   rosuvastatin (CRESTOR) 10 MG tablet    Sig: Take 1 tablet (10 mg total) by mouth daily.    Dispense:  90 tablet    Refill:  1      Follow-up: Return in about 6 months (around 08/27/2021).  Sanda Linger, MD

## 2021-02-24 NOTE — Patient Instructions (Signed)
Health Maintenance, Male Adopting a healthy lifestyle and getting preventive care are important in promoting health and wellness. Ask your health care provider about: The right schedule for you to have regular tests and exams. Things you can do on your own to prevent diseases and keep yourself healthy. What should I know about diet, weight, and exercise? Eat a healthy diet  Eat a diet that includes plenty of vegetables, fruits, low-fat dairy products, and lean protein. Do not eat a lot of foods that are high in solid fats, added sugars, or sodium.  Maintain a healthy weight Body mass index (BMI) is a measurement that can be used to identify possible weight problems. It estimates body fat based on height and weight. Your health care provider can help determine your BMI and help you achieve or maintain ahealthy weight. Get regular exercise Get regular exercise. This is one of the most important things you can do for your health. Most adults should: Exercise for at least 150 minutes each week. The exercise should increase your heart rate and make you sweat (moderate-intensity exercise). Do strengthening exercises at least twice a week. This is in addition to the moderate-intensity exercise. Spend less time sitting. Even light physical activity can be beneficial. Watch cholesterol and blood lipids Have your blood tested for lipids and cholesterol at 66 years of age, then havethis test every 5 years. You may need to have your cholesterol levels checked more often if: Your lipid or cholesterol levels are high. You are older than 66 years of age. You are at high risk for heart disease. What should I know about cancer screening? Many types of cancers can be detected early and may often be prevented. Depending on your health history and family history, you may need to have cancer screening at various ages. This may include screening for: Colorectal cancer. Prostate cancer. Skin cancer. Lung  cancer. What should I know about heart disease, diabetes, and high blood pressure? Blood pressure and heart disease High blood pressure causes heart disease and increases the risk of stroke. This is more likely to develop in people who have high blood pressure readings, are of African descent, or are overweight. Talk with your health care provider about your target blood pressure readings. Have your blood pressure checked: Every 3-5 years if you are 18-39 years of age. Every year if you are 40 years old or older. If you are between the ages of 65 and 75 and are a current or former smoker, ask your health care provider if you should have a one-time screening for abdominal aortic aneurysm (AAA). Diabetes Have regular diabetes screenings. This checks your fasting blood sugar level. Have the screening done: Once every three years after age 45 if you are at a normal weight and have a low risk for diabetes. More often and at a younger age if you are overweight or have a high risk for diabetes. What should I know about preventing infection? Hepatitis B If you have a higher risk for hepatitis B, you should be screened for this virus. Talk with your health care provider to find out if you are at risk forhepatitis B infection. Hepatitis C Blood testing is recommended for: Everyone born from 1945 through 1965. Anyone with known risk factors for hepatitis C. Sexually transmitted infections (STIs) You should be screened each year for STIs, including gonorrhea and chlamydia, if: You are sexually active and are younger than 66 years of age. You are older than 66 years of age   and your health care provider tells you that you are at risk for this type of infection. Your sexual activity has changed since you were last screened, and you are at increased risk for chlamydia or gonorrhea. Ask your health care provider if you are at risk. Ask your health care provider about whether you are at high risk for HIV.  Your health care provider may recommend a prescription medicine to help prevent HIV infection. If you choose to take medicine to prevent HIV, you should first get tested for HIV. You should then be tested every 3 months for as long as you are taking the medicine. Follow these instructions at home: Lifestyle Do not use any products that contain nicotine or tobacco, such as cigarettes, e-cigarettes, and chewing tobacco. If you need help quitting, ask your health care provider. Do not use street drugs. Do not share needles. Ask your health care provider for help if you need support or information about quitting drugs. Alcohol use Do not drink alcohol if your health care provider tells you not to drink. If you drink alcohol: Limit how much you have to 0-2 drinks a day. Be aware of how much alcohol is in your drink. In the [place] one drink equals one 12 oz bottle of beer (355 mL), one 5 oz glass of wine (148 mL), or one 1 oz glass of hard liquor (44 mL). General instructions Schedule regular health, dental, and eye exams. Stay current with your vaccines. Tell your health care provider if: You often feel depressed. You have ever been abused or do not feel safe at home. Summary Adopting a healthy lifestyle and getting preventive care are important in promoting health and wellness. Follow your health care provider's instructions about healthy diet, exercising, and getting tested or screened for diseases. Follow your health care provider's instructions on monitoring your cholesterol and blood pressure. This information is not intended to replace advice given to you by your health care provider. Make sure you discuss any questions you have with your healthcare provider. Document Revised: 07/18/2018 Document Reviewed: 07/18/2018 Elsevier Patient Education  2022 [employer] Inc[employer]  

## 2021-02-25 ENCOUNTER — Other Ambulatory Visit (INDEPENDENT_AMBULATORY_CARE_PROVIDER_SITE_OTHER): Payer: Medicare Other

## 2021-02-25 DIAGNOSIS — Z125 Encounter for screening for malignant neoplasm of prostate: Secondary | ICD-10-CM

## 2021-02-25 LAB — TSH: TSH: 0.68 u[IU]/mL (ref 0.35–5.50)

## 2021-02-25 LAB — PSA: PSA: 0.31 ng/mL (ref 0.10–4.00)

## 2021-02-25 MED ORDER — ROSUVASTATIN CALCIUM 10 MG PO TABS
10.0000 mg | ORAL_TABLET | Freq: Every day | ORAL | 1 refills | Status: AC
Start: 2021-02-25 — End: ?

## 2021-04-28 ENCOUNTER — Telehealth (INDEPENDENT_AMBULATORY_CARE_PROVIDER_SITE_OTHER): Payer: Medicare Other | Admitting: Adult Health

## 2021-04-28 DIAGNOSIS — U071 COVID-19: Secondary | ICD-10-CM | POA: Diagnosis not present

## 2021-04-28 MED ORDER — NIRMATRELVIR/RITONAVIR (PAXLOVID)TABLET: 3.0000 | ORAL_TABLET | Freq: Two times a day (BID) | ORAL | 0 refills | Status: AC

## 2021-04-28 NOTE — Progress Notes (Signed)
Virtual Visit via Video Note  I connected with Dylan Callahan on 04/28/21 at  4:00 PM EDT by a video enabled telemedicine application and verified that I am speaking with the correct person using two identifiers.  Location patient: home Location provider:work or home office Persons participating in the virtual visit: patient, provider  I discussed the limitations of evaluation and management by telemedicine and the availability of in person appointments. The patient expressed understanding and agreed to proceed.   HPI: Is 65 year old male who is being evaluated today for an acute issue of active COVID-19 infection.  His symptoms started yesterday, he tested positive twice with 2 at home test yesterday.  Symptoms include sore throat, diaphoresis, low-grade fever up to 99.9, similar productive cough, diarrhea, and fatigue.  He denies nausea, vomiting, or worsening joint pain.  He is interested in antiviral therapy   ROS: See pertinent positives and negatives per HPI.  Past Medical History:  Diagnosis Date   Arthritis    Behcet's syndrome (HCC)    Chronic fatigue syndrome    ED (erectile dysfunction)    Hypertension     Past Surgical History:  Procedure Laterality Date   BACK SURGERY     COLONOSCOPY     HEMORROIDECTOMY     MENISCUS REPAIR      Family History  Problem Relation Age of Onset   Dementia Mother        Current Outpatient Medications:    amLODipine (NORVASC) 10 MG tablet, Take 1 tablet (10 mg total) by mouth daily., Disp: 90 tablet, Rfl: 0   aspirin 81 MG tablet, Take 81 mg by mouth daily.  , Disp: , Rfl:    nirmatrelvir/ritonavir EUA (PAXLOVID) 20 x 150 MG & 10 x 100MG  TABS, Take 3 tablets by mouth 2 (two) times daily for 5 days. (Take nirmatrelvir 150 mg two tablets twice daily for 5 days and ritonavir 100 mg one tablet twice daily for 5 days) Patient GFR is 65, Disp: 30 tablet, Rfl: 0   tadalafil (CIALIS) 20 MG tablet, Take 20 mg by mouth daily as needed.,  Disp: , Rfl:    pantoprazole (PROTONIX) 40 MG tablet, Take 40 mg by mouth daily. (Patient not taking: Reported on 04/28/2021), Disp: , Rfl:    rosuvastatin (CRESTOR) 10 MG tablet, Take 1 tablet (10 mg total) by mouth daily. (Patient not taking: Reported on 04/28/2021), Disp: 90 tablet, Rfl: 1   Suvorexant (BELSOMRA) 15 MG TABS, Take 15 mg by mouth at bedtime as needed. (Patient not taking: Reported on 04/28/2021), Disp: 90 tablet, Rfl: 1  EXAM:  VITALS per patient if applicable:  GENERAL: alert, oriented, appears well and in no acute distress  HEENT: atraumatic, conjunttiva clear, no obvious abnormalities on inspection of external nose and ears  NECK: normal movements of the head and neck  LUNGS: on inspection no signs of respiratory distress, breathing rate appears normal, no obvious gross SOB, gasping or wheezing  CV: no obvious cyanosis  MS: moves all visible extremities without noticeable abnormality  PSYCH/NEURO: pleasant and cooperative, no obvious depression or anxiety, speech and thought processing grossly intact  ASSESSMENT AND PLAN:  Discussed the following assessment and plan:  1. COVID-19 virus infection -Discussed side effects of Paxil bid.  He is okay with taking this medication.  Advise follow-up as needed - nirmatrelvir/ritonavir EUA (PAXLOVID) 20 x 150 MG & 10 x 100MG  TABS; Take 3 tablets by mouth 2 (two) times daily for 5 days. (Take nirmatrelvir 150 mg two tablets  twice daily for 5 days and ritonavir 100 mg one tablet twice daily for 5 days) Patient GFR is 65  Dispense: 30 tablet; Refill: 0      I discussed the assessment and treatment plan with the patient. The patient was provided an opportunity to ask questions and all were answered. The patient agreed with the plan and demonstrated an understanding of the instructions.   The patient was advised to call back or seek an in-person evaluation if the symptoms worsen or if the condition fails to improve as  anticipated.   Shirline Frees, NP

## 2021-05-21 ENCOUNTER — Other Ambulatory Visit: Payer: Self-pay | Admitting: Internal Medicine

## 2021-05-21 DIAGNOSIS — I10 Essential (primary) hypertension: Secondary | ICD-10-CM

## 2021-05-22 ENCOUNTER — Other Ambulatory Visit: Payer: Self-pay | Admitting: Internal Medicine

## 2021-05-22 DIAGNOSIS — I10 Essential (primary) hypertension: Secondary | ICD-10-CM

## 2021-06-18 ENCOUNTER — Telehealth: Payer: Self-pay | Admitting: Internal Medicine

## 2021-06-18 MED ORDER — PANTOPRAZOLE SODIUM 40 MG PO TBEC: 40.0000 mg | DELAYED_RELEASE_TABLET | Freq: Every day | ORAL | 0 refills | Status: AC

## 2021-06-18 NOTE — Telephone Encounter (Signed)
1.Medication Requested: pantoprazole (PROTONIX) 40 MG tablet  2. Pharmacy (Name, Street, [place]): [id number] - [place], [place] - [address]  3. On Med List: yes   4. Last Visit with PCP: 02-24-2021  5. Next visit date with PCP: n/a   Agent: Please be advised that RX refills may take up to 3 business days. We ask that you follow-up with your pharmacy.

## 2021-08-27 ENCOUNTER — Other Ambulatory Visit: Payer: Self-pay

## 2021-08-27 DIAGNOSIS — I10 Essential (primary) hypertension: Secondary | ICD-10-CM

## 2021-08-27 MED ORDER — AMLODIPINE BESYLATE 10 MG PO TABS: 10.0000 mg | ORAL_TABLET | Freq: Every day | ORAL | 0 refills | Status: DC

## 2021-11-26 ENCOUNTER — Other Ambulatory Visit: Payer: Self-pay | Admitting: Internal Medicine

## 2021-11-26 DIAGNOSIS — I10 Essential (primary) hypertension: Secondary | ICD-10-CM

## 2021-12-02 ENCOUNTER — Other Ambulatory Visit: Payer: Self-pay | Admitting: Internal Medicine

## 2021-12-03 ENCOUNTER — Other Ambulatory Visit: Payer: Self-pay | Admitting: Internal Medicine

## 2021-12-06 ENCOUNTER — Other Ambulatory Visit: Payer: Self-pay | Admitting: Internal Medicine

## 2021-12-08 DIAGNOSIS — Z0001 Encounter for general adult medical examination with abnormal findings: Secondary | ICD-10-CM | POA: Diagnosis not present

## 2021-12-08 DIAGNOSIS — R2231 Localized swelling, mass and lump, right upper limb: Secondary | ICD-10-CM | POA: Diagnosis not present

## 2021-12-08 DIAGNOSIS — K5730 Diverticulosis of large intestine without perforation or abscess without bleeding: Secondary | ICD-10-CM | POA: Diagnosis not present

## 2021-12-08 DIAGNOSIS — R7303 Prediabetes: Secondary | ICD-10-CM | POA: Diagnosis not present

## 2021-12-08 DIAGNOSIS — I10 Essential (primary) hypertension: Secondary | ICD-10-CM | POA: Diagnosis not present

## 2021-12-08 DIAGNOSIS — Z79899 Other long term (current) drug therapy: Secondary | ICD-10-CM | POA: Diagnosis not present

## 2022-01-10 ENCOUNTER — Encounter: Payer: No Typology Code available for payment source | Admitting: Internal Medicine

## 2022-09-07 ENCOUNTER — Telehealth: Payer: Self-pay | Admitting: Internal Medicine

## 2022-09-07 NOTE — Telephone Encounter (Signed)
Left message for patient to call back and schedule Medicare Annual Wellness Visit (AWV) in office.   Please offer to do virtually or by telephone.   Last AWV  due 05/08/2017 awvi per [facility]  Please schedule at any time with [facility].  30 minute appointment  Any questions, please contact me at [phone]   Thank you,   [place] [facility] [place] [facility] ??[phone] or ??[phone]

## 2022-12-13 ENCOUNTER — Telehealth: Payer: Self-pay | Admitting: Internal Medicine

## 2022-12-13 NOTE — Telephone Encounter (Signed)
Patient self scheduled via MyChart for a new patient appointment with Dr. Alvy Bimler on 03/14/2023. He is technically a current patient of Dr. Yetta Barre, but has not been seen since 02/24/2021. Are both providers okay with this transition?

## 2022-12-13 NOTE — Telephone Encounter (Signed)
I am okay with this.  Thanks.

## 2022-12-13 NOTE — Telephone Encounter (Signed)
Ok with me 

## 2023-03-14 ENCOUNTER — Ambulatory Visit: Payer: No Typology Code available for payment source | Admitting: Emergency Medicine

## 2023-09-21 ENCOUNTER — Other Ambulatory Visit: Payer: Self-pay | Admitting: Family Medicine

## 2023-09-21 ENCOUNTER — Ambulatory Visit
Admission: RE | Admit: 2023-09-21 | Discharge: 2023-09-21 | Disposition: A | Discharge status: Home / Self Care | Payer: Medicare HMO | Source: Ambulatory Visit | Attending: Family Medicine | Admitting: Family Medicine

## 2023-09-21 DIAGNOSIS — M160 Bilateral primary osteoarthritis of hip: Secondary | ICD-10-CM

## 2024-05-09 ENCOUNTER — Encounter: Payer: Self-pay | Admitting: Family Medicine

## 2024-07-30 ENCOUNTER — Ambulatory Visit
Admission: RE | Admit: 2024-07-30 | Discharge: 2024-07-30 | Disposition: A | Discharge status: Home / Self Care | Source: Ambulatory Visit | Attending: Family Medicine | Admitting: Family Medicine

## 2024-07-30 ENCOUNTER — Other Ambulatory Visit: Payer: Self-pay | Admitting: Family Medicine

## 2024-07-30 DIAGNOSIS — M25511 Pain in right shoulder: Secondary | ICD-10-CM
